# Patient Record
Sex: Female | Born: 1940 | Race: Black or African American | Hispanic: No | Marital: Single | Smoking: Never smoker
Health system: Southern US, Community
[De-identification: ages and names within clinical notes are randomized; demographics above are authoritative.]

## PROBLEM LIST (undated history)

## (undated) DIAGNOSIS — I10 Essential (primary) hypertension: Secondary | ICD-10-CM

## (undated) HISTORY — PX: VAGINAL PROLAPSE REPAIR: SHX830

---

## 2014-11-08 ENCOUNTER — Other Ambulatory Visit: Payer: Self-pay | Admitting: Family Medicine

## 2014-11-08 ENCOUNTER — Ambulatory Visit
Admission: RE | Admit: 2014-11-08 | Discharge: 2014-11-08 | Disposition: A | Discharge status: Home / Self Care | Payer: No Typology Code available for payment source | Source: Ambulatory Visit | Attending: Family Medicine | Admitting: Family Medicine

## 2014-11-08 DIAGNOSIS — M25551 Pain in right hip: Secondary | ICD-10-CM

## 2015-04-24 ENCOUNTER — Encounter (HOSPITAL_COMMUNITY): Payer: Self-pay | Admitting: Emergency Medicine

## 2015-04-24 ENCOUNTER — Emergency Department (HOSPITAL_COMMUNITY)
Admission: EM | Admit: 2015-04-24 | Discharge: 2015-04-24 | Disposition: A | Discharge status: Home / Self Care | Payer: Self-pay | Attending: Emergency Medicine | Admitting: Emergency Medicine

## 2015-04-24 DIAGNOSIS — N8110 Cystocele, unspecified: Secondary | ICD-10-CM | POA: Insufficient documentation

## 2015-04-24 DIAGNOSIS — I10 Essential (primary) hypertension: Secondary | ICD-10-CM | POA: Insufficient documentation

## 2015-04-24 DIAGNOSIS — Z23 Encounter for immunization: Secondary | ICD-10-CM | POA: Insufficient documentation

## 2015-04-24 HISTORY — DX: Essential (primary) hypertension: I10

## 2015-04-24 LAB — URINE MICROSCOPIC-ADD ON

## 2015-04-24 LAB — URINALYSIS, ROUTINE W REFLEX MICROSCOPIC
Bilirubin Urine: NEGATIVE
Glucose, UA: NEGATIVE mg/dL
Ketones, ur: NEGATIVE mg/dL
Nitrite: NEGATIVE
Protein, ur: 30 mg/dL — AB
SPECIFIC GRAVITY, URINE: 1.025 (ref 1.005–1.030)
Urobilinogen, UA: 0.2 mg/dL (ref 0.0–1.0)
pH: 6.5 (ref 5.0–8.0)

## 2015-04-24 MED ORDER — TETANUS-DIPHTH-ACELL PERTUSSIS 5-2.5-18.5 LF-MCG/0.5 IM SUSP
0.5000 mL | Freq: Once | INTRAMUSCULAR | Status: AC
  Administered 2015-04-24: 0.5 mL via INTRAMUSCULAR
  Filled 2015-04-24: qty 0.5

## 2015-04-24 MED ORDER — CEPHALEXIN 500 MG PO CAPS: 500.0000 mg | ORAL_CAPSULE | Freq: Two times a day (BID) | ORAL | Status: AC

## 2015-04-24 MED ORDER — SENNOSIDES-DOCUSATE SODIUM 8.6-50 MG PO TABS: 1.0000 | ORAL_TABLET | Freq: Every day | ORAL | Status: AC

## 2015-04-24 NOTE — ED Notes (Signed)
Per OBGYN, pt requires foley catheter to relieve bladder until packing can be removed from vagina at MD appointment

## 2015-04-24 NOTE — ED Provider Notes (Signed)
CSN: [id number]     Arrival date & time 04/24/15  0830 History   First MD Initiated Contact with Patient 04/24/15 0840     Chief Complaint  Patient presents with  . Vaginal Prolapse     (Consider location/radiation/quality/duration/timing/severity/associated sxs/prior Treatment) HPI  Blood pressure 155/75, pulse 74, temperature 99 F (37.2 C), temperature source Oral, resp. rate 16, height  (1.575 m), weight 174 lb (78.926 kg), SpO2 95 %.  Christina Hooper is a 74 y.o. female with past medical history significant for hypertension complaining of painful vaginal prolapse. Patient saw her PCP one week ago the prolapse is manually reduced but immediately recurred. Patient has not self reduced, she's had difficulty urinating and states she is afraid to have a bowel movement for fear of worsening the prolapse. Patient has had 6 children's, starting approximately 2 months ago and has just increased in severity. She does not have an OB/GYN but her primary care physician has arranged for her to see Dr. Juliene Pina in 4 days.  Past Medical History  Diagnosis Date  . Hypertension    History reviewed. No pertinent past surgical history. History reviewed. No pertinent family history. History  Substance Use Topics  . Smoking status: Never Smoker   . Smokeless tobacco: Not on file  . Alcohol Use: No   OB History    No data available     Review of Systems  10 systems reviewed and found to be negative, except as noted in the HPI.   Allergies  Review of patient's allergies indicates no known allergies.  Home Medications   Prior to Admission medications   Medication Sig Start Date End Date Taking? Authorizing Provider  cephALEXin (KEFLEX) 500 MG capsule Take 1 capsule (500 mg total) by mouth 2 (two) times daily. 04/24/15   Josephyne Tarter, PA-C  senna-docusate (SENOKOT-S) 8.6-50 MG per tablet Take 1 tablet by mouth daily. 04/24/15   Ahmaad Neidhardt, PA-C   BP 158/79 mmHg  Pulse 62  Temp(Src)  98.4 F (36.9 C) (Oral)  Resp 16  Ht  (1.575 m)  Wt 174 lb (78.926 kg)  BMI 31.82 kg/m2  SpO2 97% Physical Exam  Constitutional: She is oriented to person, place, and time. She appears well-developed and well-nourished. No distress.  HENT:  Head: Normocephalic.  Eyes: Conjunctivae and EOM are normal. Pupils are equal, round, and reactive to light.  Neck: Normal range of motion.  Cardiovascular: Normal rate and regular rhythm.   Pulmonary/Chest: Effort normal and breath sounds normal. No stridor.  Abdominal: Soft.  Genitourinary:  You exam is chaperoned by nurse: There is a 12 cm prolapse of the vaginal including the cervix with mild excoriation and no warmth, discharge or significant cellulitis.   Musculoskeletal: Normal range of motion.  Neurological: She is alert and oriented to person, place, and time.  Psychiatric: She has a normal mood and affect.  Nursing note and vitals reviewed.   ED Course  Hernia reduction Date/Time: 04/24/2015 12:38 PM Performed by: Wynetta Emery Authorized by: Wynetta Emery Consent: Verbal consent obtained. Consent given by: patient Patient identity confirmed: verbally with patient Time out: Immediately prior to procedure a "time out" was called to verify the correct patient, procedure, equipment, support staff and site/side marked as required. Preparation: Patient was prepped and draped in the usual sterile fashion. Local anesthesia used: no Patient tolerance: Patient tolerated the procedure well with no immediate complications Comments: Vagina manually reduced without complication, patient reports immediate improvement in pain and discomfort. Vagina  is packed with ultrasound gel coated Kerlix. Foley catheter placed.   (including critical care time) Labs Review Labs Reviewed  URINALYSIS, ROUTINE W REFLEX MICROSCOPIC (NOT AT [facility]) - Abnormal; Notable for the following:    Hgb urine dipstick MODERATE (*)    Protein, ur 30 (*)     Leukocytes, UA MODERATE (*)    All other components within normal limits  URINE MICROSCOPIC-ADD ON - Abnormal; Notable for the following:    Squamous Epithelial / LPF FEW (*)    Bacteria, UA MANY (*)    All other components within normal limits  URINE CULTURE    Imaging Review No results found.   EKG Interpretation None      MDM   Final diagnoses:  Vaginal prolapse    Filed Vitals:   04/24/15 1100 04/24/15 1130 04/24/15 1215 04/24/15 1223  BP: 147/83 164/79 158/79   Pulse: 76 68 62   Temp:    98.4 F (36.9 C)  TempSrc:    Oral  Resp:      Height:      Weight:      SpO2: 94% 95% 97%     Medications  Tdap (BOOSTRIX) injection 0.5 mL (0.5 mLs Intramuscular Given 04/24/15 0951)    Christina Hooper is a pleasant 74 y.o. female presenting with Significant vaginal/uterine prolapse. The prolapse has been out for approximately 1 week, the area is excoriated, there is no warmth or discharge to suggest infection. Patient states she's had difficulty urinating likely secondary to mechanical obstruction. Urinalysis with many bacteria but negative nitrites. Urine culture is ordered and Keflex will be given for presumed early UTI. She has no fever, CVA tenderness or signs of systemic infection. Prolapse is reduced in case discussed with Dr. Nikki Dom OB/GYN. She recommends packing the vagina with Kerlix. Patient will need a Foley because the compression from the vaginal packing will put pressure on the ureter. Packing is performed and patient is given prescription for stool softener and Keflex, Dr. Juliene Pina will see the patient in the office tomorrow.  Evaluation does not show pathology that would require ongoing emergent intervention or inpatient treatment. Pt is hemodynamically stable and mentating appropriately. Discussed findings and plan with patient/guardian, who agrees with care plan. All questions answered. Return precautions discussed and outpatient follow up given.   New  Prescriptions   CEPHALEXIN (KEFLEX) 500 MG CAPSULE    Take 1 capsule (500 mg total) by mouth 2 (two) times daily.   SENNA-DOCUSATE (SENOKOT-S) 8.6-50 MG PER TABLET    Take 1 tablet by mouth daily.        Wynetta Emery, PA-C 04/24/15 1244  Elwin Mocha, MD 04/24/15 1451

## 2015-04-24 NOTE — ED Notes (Signed)
Pt was diagnosed with a prolapsed uterus by primary MD. Pt is scheduled to see OBGYN however pain is so intense she cannot sit- pt could not wait for appointment. Pt here for pain management.

## 2015-04-24 NOTE — Discharge Instructions (Signed)
Please follow with your primary care doctor in the next 2 days for a check-up. They must obtain records for further management.   Do not hesitate to return to the Emergency Department for any new, worsening or concerning symptoms.   Patient information: Pelvic organ prolapse (The Basics) View in Spanish  Written by the doctors and editors at [employer]  What is pelvic organ prolapse? -- Pelvic organ prolapse is a condition that only affects women. It happens when tissues that support the organs in the lower belly relax. These tissues are sometimes called the "pelvic floor." When they relax too much, the organs drop down and press against or bulge into the vagina. If the bladder bulges into the vagina, doctors call this problem "cystocele." If the rectum bulges into the vagina, they call it "rectocele." Uterine prolapse means the uterus has bulged into the vagina (figure 1). Some things can increase a woman's risk of having pelvic organ prolapse. They include pregnancy, past hysterectomy (surgery to remove the uterus), obesity, and older age. What are the symptoms of pelvic organ prolapse? -- Many women with this problem have no symptoms. But some women with pelvic organ prolapse have symptoms that include: ?Fullness or pressure in the pelvis or vagina ?A bulge in the vagina or coming out of the vagina ?Leaking urine when they laugh, cough, or sneeze ?Needing to urinate all of a sudden When they use the toilet, some women need to press on the bulge in the vagina with a finger to get out all their urine or to finish a bowel movement. Is there a test for pelvic organ prolapse? -- Your doctor or nurse will be able to tell if you have it by doing a pelvic exam. Is there anything I can do on my own to feel better? -- Yes. Some women feel better if they do pelvic muscle exercises. These exercises strengthen the muscles that control the flow of urine and bowel movements. They are also known as "Kegel"  exercises. Your nurse or doctor can teach you how to do them or refer you to a physical therapist who specializes in pelvic floor problems. How is pelvic organ prolapse treated? -- Women who have no symptoms or who are not bothered by their symptoms do not need treatment. For women with symptoms that bother them, doctors suggest different treatments, including: ?Pelvic floor muscle exercises - Patients work with a physical therapist for 8 to 12 weeks to strengthen the pelvic muscles. ?A vaginal pessary - This device fits inside a woman's vagina to support the bladder and push it back into place. Pessaries come in different shapes and sizes. ?Surgery - A surgeon can move dropped organs back where they belong and strengthen the tissues that keep them in place. Women should have this type of surgery only if they are done having children. Can pelvic organ prolapse be prevented? -- You can reduce your chances of pelvic organ prolapse if you: ?Lose weight if you are overweight ?Get treated for constipation if you are constipated ?Avoid activities that require you to lift heavy things

## 2015-04-25 LAB — URINE CULTURE

## 2016-03-10 IMAGING — CR DG HIP (WITH OR WITHOUT PELVIS) 2-3V*R*
3 series · 3 of 3 positions shown · non-contrast
Comparison: None.

CLINICAL DATA: Right hip pain, no injury

EXAM:
RIGHT HIP (WITH PELVIS) 2-3 VIEWS

[view not recorded (1 of 3)]
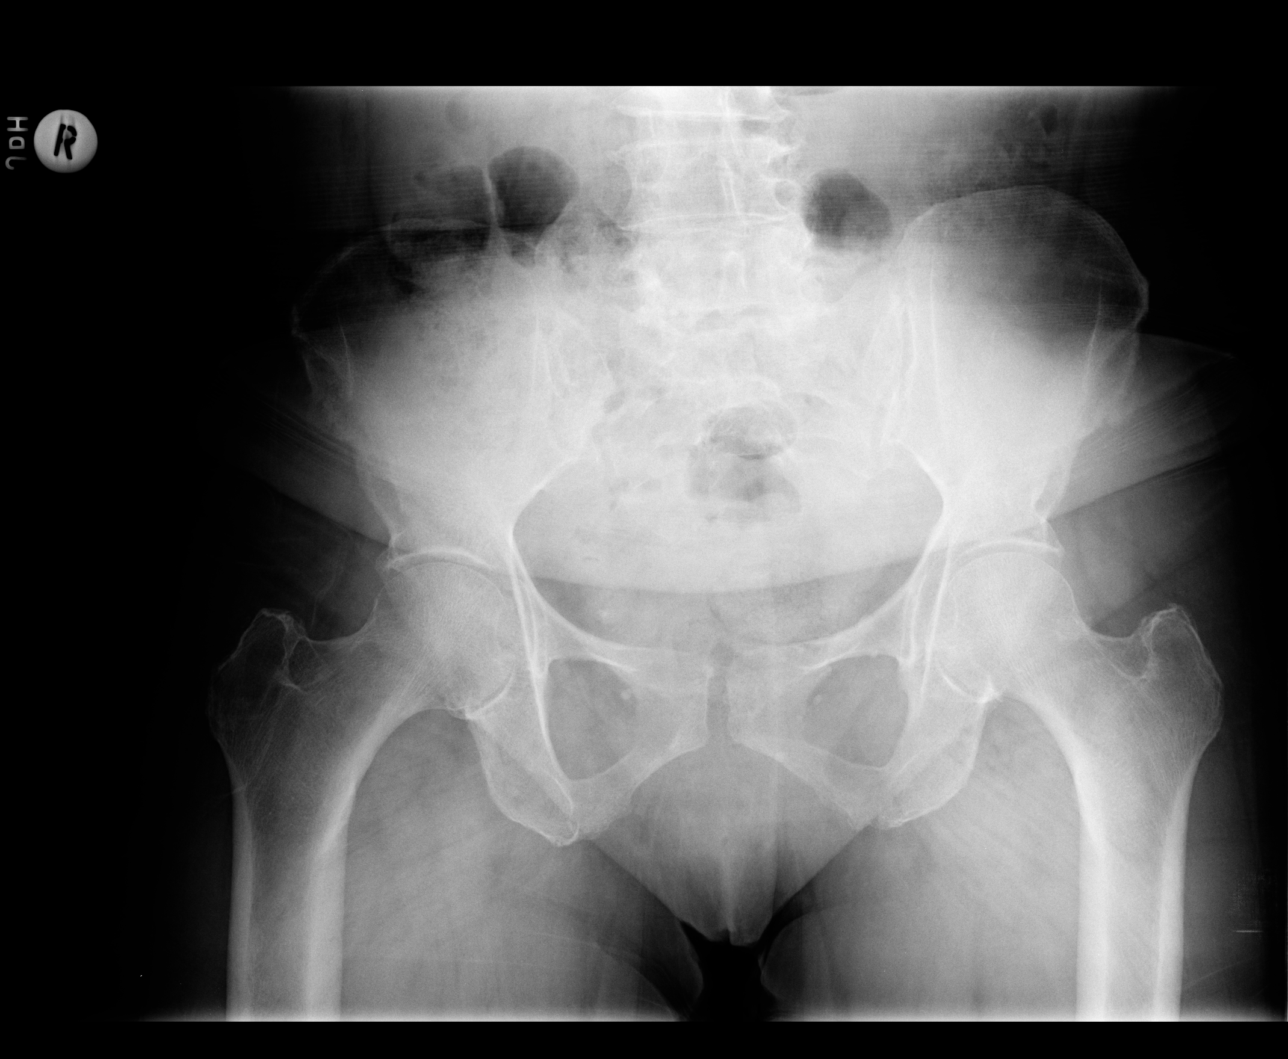

[view not recorded (2 of 3)]
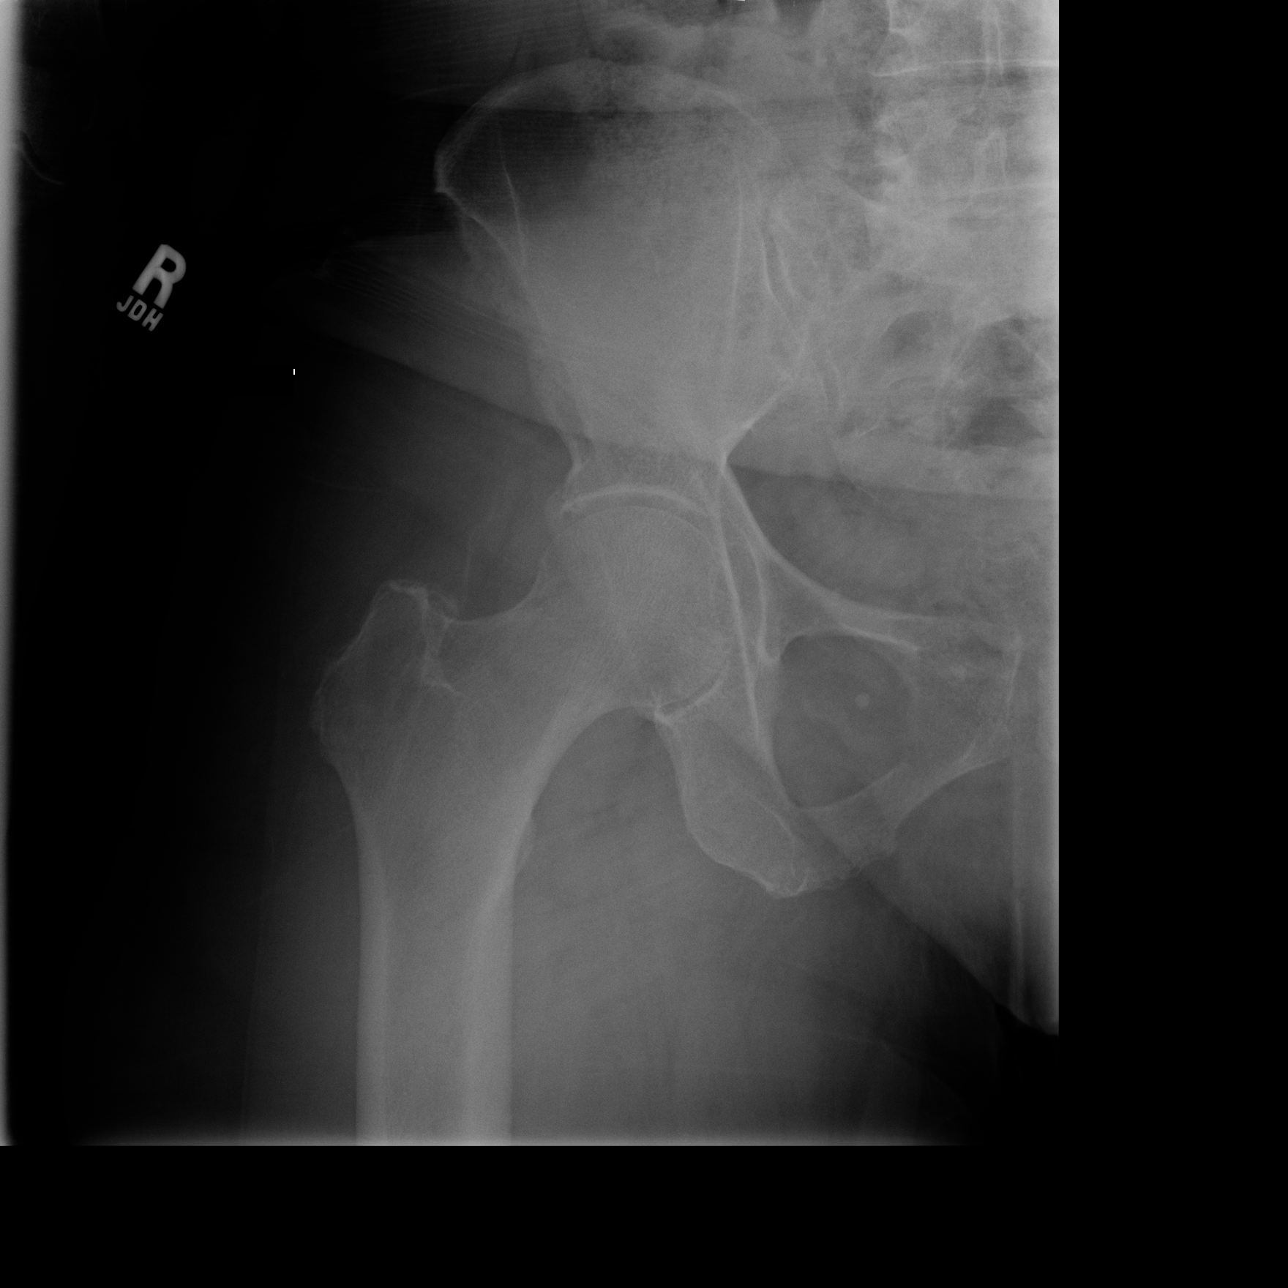

[view not recorded (3 of 3)]
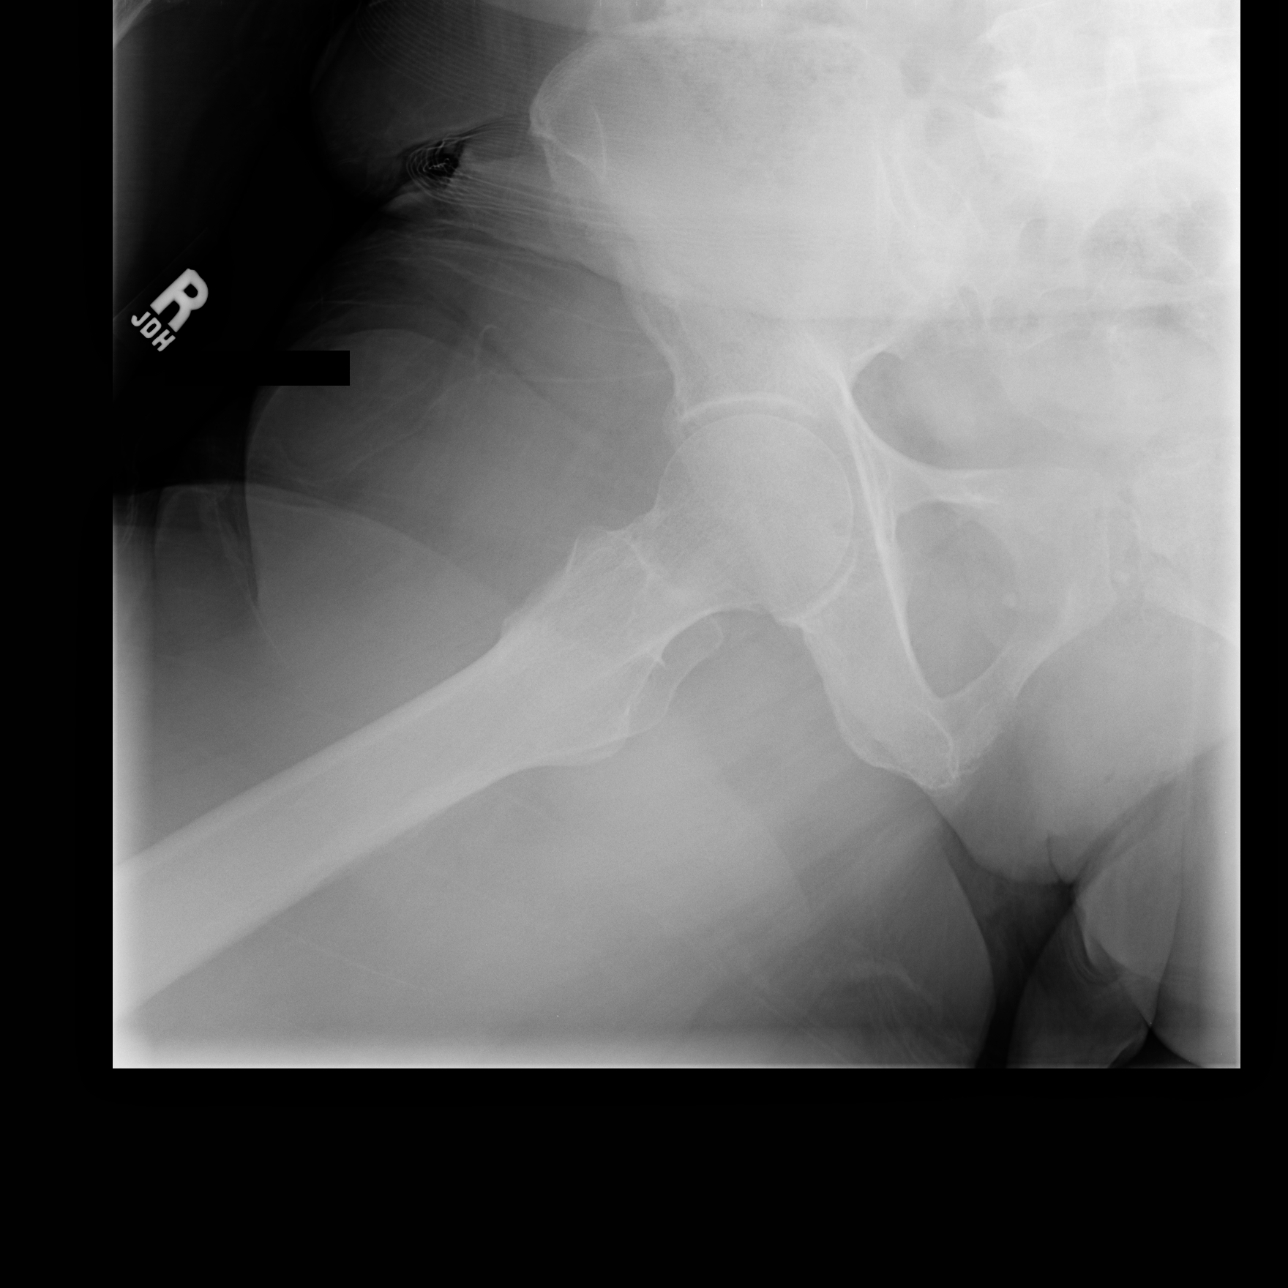

[3 of 3 positions shown; findings below may reference images not displayed]

FINDINGS: Negative for fracture or mass. Negative for AVN. Hip joint is normal
without significant degenerative change.
IMPRESSION: Negative

## 2021-01-05 ENCOUNTER — Other Ambulatory Visit: Payer: Self-pay | Admitting: Internal Medicine

## 2021-01-06 LAB — TSH: TSH: 0.01 mIU/L — ABNORMAL LOW (ref 0.40–4.50)

## 2021-01-06 LAB — COMPLETE METABOLIC PANEL WITH GFR
GFR, Est African American: 66 mL/min/{1.73_m2} (ref >=60)
Glucose, Bld: 67 mg/dL (ref 65–99)
Total Bilirubin: 1.1 mg/dL (ref 0.2–1.2)

## 2021-01-06 LAB — LIPID PANEL
Cholesterol: 165 mg/dL (ref <200)
LDL Cholesterol (Calc): 63 mg/dL (calc)

## 2021-01-06 LAB — CBC: Platelets: 216 10*3/uL (ref 140–400)

## 2021-01-10 LAB — COMPLETE METABOLIC PANEL WITH GFR
AG Ratio: 1.8 (calc) (ref 1.0–2.5)
ALT: 19 U/L (ref 6–29)
AST: 19 U/L (ref 10–35)
Albumin: 4.4 g/dL (ref 3.6–5.1)
Alkaline phosphatase (APISO): 68 U/L (ref 37–153)
BUN/Creatinine Ratio: 25 (calc) — ABNORMAL HIGH (ref 6–22)
BUN: 24 mg/dL (ref 7–25)
CO2: 32 mmol/L (ref 20–32)
Calcium: 10.2 mg/dL (ref 8.6–10.4)
Chloride: 104 mmol/L (ref 98–110)
Creat: 0.96 mg/dL — ABNORMAL HIGH (ref 0.60–0.93)
GFR, Est Non African American: 57 mL/min/{1.73_m2} — ABNORMAL LOW (ref >=60)
Globulin: 2.4 g/dL (calc) (ref 1.9–3.7)
Potassium: 4.1 mmol/L (ref 3.5–5.3)
Sodium: 143 mmol/L (ref 135–146)
Total Protein: 6.8 g/dL (ref 6.1–8.1)

## 2021-01-10 LAB — LIPID PANEL
HDL: 81 mg/dL (ref >=50)
Non-HDL Cholesterol (Calc): 84 mg/dL (calc) (ref <130)
Total CHOL/HDL Ratio: 2 (calc) (ref <5.0)
Triglycerides: 125 mg/dL (ref <150)

## 2021-01-10 LAB — CBC
HCT: 40.5 % (ref 35.0–45.0)
Hemoglobin: 12.8 g/dL (ref 11.7–15.5)
MCH: 29.1 pg (ref 27.0–33.0)
MCHC: 31.6 g/dL — ABNORMAL LOW (ref 32.0–36.0)
MCV: 92 fL (ref 80.0–100.0)
MPV: 11 fL (ref 7.5–12.5)
RBC: 4.4 10*6/uL (ref 3.80–5.10)
RDW: 11 % (ref 11.0–15.0)
WBC: 6.5 10*3/uL (ref 3.8–10.8)

## 2021-01-10 LAB — T3: T3, Total: 104 ng/dL (ref 76–181)

## 2021-01-10 LAB — T3 UPTAKE: T3 Uptake: 31 % (ref 22–35)

## 2021-01-10 LAB — T4, FREE: Free T4: 1.4 ng/dL (ref 0.8–1.8)

## 2022-07-11 ENCOUNTER — Encounter: Payer: Self-pay | Admitting: Family Medicine

## 2022-07-11 ENCOUNTER — Ambulatory Visit: Payer: 59 | Attending: Family Medicine | Admitting: Family Medicine

## 2022-07-11 VITALS — BP 134/69 | HR 78 | Temp 99.3°F | Ht 61.5 in | Wt 157.0 lb

## 2022-07-11 DIAGNOSIS — M1712 Unilateral primary osteoarthritis, left knee: Secondary | ICD-10-CM

## 2022-07-11 DIAGNOSIS — E2839 Other primary ovarian failure: Secondary | ICD-10-CM

## 2022-07-11 DIAGNOSIS — N3281 Overactive bladder: Secondary | ICD-10-CM | POA: Diagnosis not present

## 2022-07-11 DIAGNOSIS — M8588 Other specified disorders of bone density and structure, other site: Secondary | ICD-10-CM

## 2022-07-11 DIAGNOSIS — R42 Dizziness and giddiness: Secondary | ICD-10-CM

## 2022-07-11 DIAGNOSIS — M5137 Other intervertebral disc degeneration, lumbosacral region: Secondary | ICD-10-CM

## 2022-07-11 DIAGNOSIS — Z23 Encounter for immunization: Secondary | ICD-10-CM | POA: Diagnosis not present

## 2022-07-11 DIAGNOSIS — Z131 Encounter for screening for diabetes mellitus: Secondary | ICD-10-CM

## 2022-07-11 DIAGNOSIS — I10 Essential (primary) hypertension: Secondary | ICD-10-CM

## 2022-07-11 DIAGNOSIS — Z1159 Encounter for screening for other viral diseases: Secondary | ICD-10-CM

## 2022-07-11 DIAGNOSIS — K5909 Other constipation: Secondary | ICD-10-CM

## 2022-07-11 MED ORDER — SOLIFENACIN SUCCINATE 5 MG PO TABS: 5.0000 mg | ORAL_TABLET | Freq: Every day | ORAL | 3 refills | Status: AC

## 2022-07-11 MED ORDER — AMLODIPINE BESYLATE 5 MG PO TABS: 5.0000 mg | ORAL_TABLET | Freq: Every day | ORAL | 1 refills | Status: DC

## 2022-07-11 MED ORDER — HYDROCHLOROTHIAZIDE 12.5 MG PO TABS: 12.5000 mg | ORAL_TABLET | Freq: Every day | ORAL | 1 refills | Status: DC

## 2022-07-11 MED ORDER — MELOXICAM 7.5 MG PO TABS: 7.5000 mg | ORAL_TABLET | Freq: Every day | ORAL | 1 refills | Status: AC

## 2022-07-11 NOTE — Patient Instructions (Signed)

## 2022-07-11 NOTE — Progress Notes (Signed)
Subjective:  Patient ID: Christina Hooper, female    DOB: 06-12-1941  Age: 81 y.o. MRN: [id number]  CC: New Patient (Initial Visit)   HPI Christina Hooper is a 81 y.o. year old female with a history of hypertension, chronic back pain and chronic knee pain who presents to establish care. Accompanied by her daughter to today's visit.  Interval History: Complains of back pain, knee pain, dizziness. Back pain feels like someone sticking her and does not radiate down her legs. Lumbar spine x-ray from 09/2020 from care everywhere revealed: 1.  No acute fracture. 2.  Mild dextrocurvature centered at L3. 3.  Trace anterolisthesis of L4 on L5. 4.  Multilevel disc disease, advanced at L4-L5 and L5-S1. 5.  Multilevel facet osteoarthritic disease, advanced at L5-S1 6.  Mild sacroiliac joint degenerative changes. 7.  Osteopenia.       Knee pain was previously being managed by [facility] orthopedic.  Last visit was in 09/2020 when she underwent aspiration of left knee with cortisone injection. Knee pain is in bilateral knes worse in the left and feels like her knees are failing Left knee x-ray from 09/2020 revealed: 1.  No acute fracture or malalignment.  2.  No joint effusion.  3.  Moderate degenerative changes of the patellofemoral and medial compartment. 4.  Osteopenia.   Dizziness is described as " feeling tired after she has done a lot of work".  Denies presence of vertigo, tinnitus and this is not associated with change of position. She also complains of urinary frequency and nocturia of up to 7 times at night.  She has no dysuria or hematuria.  She has also be constipated. Past Medical History:  Diagnosis Date   Hypertension     Past Surgical History:  Procedure Laterality Date   VAGINAL PROLAPSE REPAIR      No family history on file.  Social History   Socioeconomic History   Marital status: Single    Spouse name: Not on file   Number of children: Not on file   Years of  education: Not on file   Highest education level: Not on file  Occupational History   Not on file  Tobacco Use   Smoking status: Never   Smokeless tobacco: Not on file  Substance and Sexual Activity   Alcohol use: No   Drug use: No   Sexual activity: Not on file  Other Topics Concern   Not on file  Social History Narrative   Not on file   Social Determinants of Health   Financial Resource Strain: Not on file  Food Insecurity: Not on file  Transportation Needs: Not on file  Physical Activity: Not on file  Stress: Not on file  Social Connections: Not on file    No Known Allergies  Outpatient Medications Prior to Visit  Medication Sig Dispense Refill   Omega-3 1000 MG CAPS Take by mouth.     omeprazole (PRILOSEC) 20 MG capsule Take 20 mg by mouth daily.     polyethylene glycol (MIRALAX / GLYCOLAX) 17 g packet Take 1 packet (17 g) by mouth daily as needed for constipation.     senna-docusate (SENOKOT-S) 8.6-50 MG per tablet Take 1 tablet by mouth daily. 20 tablet 0   amLODipine (NORVASC) 5 MG tablet Take 5 mg by mouth daily.     hydrochlorothiazide (HYDRODIURIL) 12.5 MG tablet Take 12.5 mg by mouth daily.     cephALEXin (KEFLEX) 500 MG capsule Take 1 capsule (500 mg total) by  mouth 2 (two) times daily. (Patient not taking: Reported on 07/11/2022) 20 capsule 0   No facility-administered medications prior to visit.     ROS Review of Systems  Constitutional:  Positive for fatigue. Negative for activity change and appetite change.  HENT:  Negative for sinus pressure and sore throat.   Respiratory:  Negative for chest tightness, shortness of breath and wheezing.   Cardiovascular:  Negative for chest pain and palpitations.  Gastrointestinal:  Negative for abdominal distention, abdominal pain and constipation.  Genitourinary: Negative.   Musculoskeletal:        See HPI  Psychiatric/Behavioral:  Negative for behavioral problems and dysphoric mood.     Objective:  BP 134/69    Pulse 78   Temp 99.3 F (37.4 C) (Oral)   Ht 5' 1.5" (1.562 m)   Wt 157 lb (71.2 kg)   SpO2 97%   BMI 29.18 kg/m      07/11/2022    9:08 AM 04/24/2015   12:15 PM 04/24/2015   11:30 AM  BP/Weight  Systolic BP 408 144 818  Diastolic BP 69 79 79  Wt. (Lbs) 157    BMI 29.18 kg/m2        Physical Exam Constitutional:      Appearance: She is well-developed.  Cardiovascular:     Rate and Rhythm: Normal rate.     Heart sounds: Normal heart sounds. No murmur heard. Pulmonary:     Effort: Pulmonary effort is normal.     Breath sounds: Normal breath sounds. No wheezing or rales.  Chest:     Chest wall: No tenderness.  Abdominal:     General: Bowel sounds are normal. There is no distension.     Palpations: Abdomen is soft. There is no mass.     Tenderness: There is no abdominal tenderness.  Musculoskeletal:        General: Normal range of motion.     Right lower leg: No edema.     Left lower leg: No edema.     Comments: No tenderness on palpation of back Crepitus or range of motion of both knees  Neurological:     Mental Status: She is alert and oriented to person, place, and time.  Psychiatric:        Mood and Affect: Mood normal.        Latest Ref Rng & Units 01/05/2021   12:00 AM  CMP  Glucose 65 - 99 mg/dL 67   BUN 7 - 25 mg/dL 24   Creatinine 0.60 - 0.93 mg/dL 0.96   Sodium 135 - 146 mmol/L 143   Potassium 3.5 - 5.3 mmol/L 4.1   Chloride 98 - 110 mmol/L 104   CO2 20 - 32 mmol/L 32   Calcium 8.6 - 10.4 mg/dL 10.2   Total Protein 6.1 - 8.1 g/dL 6.8   Total Bilirubin 0.2 - 1.2 mg/dL 1.1   AST 10 - 35 U/L 19   ALT 6 - 29 U/L 19     Lipid Panel     Component Value Date/Time   CHOL 165 01/05/2021 0000   TRIG 125 01/05/2021 0000   HDL 81 01/05/2021 0000   CHOLHDL 2.0 01/05/2021 0000   LDLCALC 63 01/05/2021 0000    CBC    Component Value Date/Time   WBC 6.5 01/05/2021 0000   RBC 4.40 01/05/2021 0000   HGB 12.8 01/05/2021 0000   HCT 40.5 01/05/2021  0000   PLT 216 01/05/2021 0000   MCV 92.0  01/05/2021 0000   MCH 29.1 01/05/2021 0000   MCHC 31.6 (L) 01/05/2021 0000   RDW 11.0 01/05/2021 0000    No results found for: "HGBA1C"  Assessment & Plan:  1. 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 Counseled on evidence of improvement in pain control with regards to yoga, water aerobics, massage, home physical therapy, exercise as tolerated. - Ambulatory referral to Physical Therapy - AMB referral to orthopedics - meloxicam (MOBIC) 7.5 MG tablet; Take 1 tablet (7.5 mg total) by mouth daily.  Dispense: 30 tablet; Refill: 1  2. Primary osteoarthritis of left knee Uncontrolled Status post cortisone injections in the past We will place an NSAID and refer her back to her orthopedic - Ambulatory referral to Physical Therapy - AMB referral to orthopedics - meloxicam (MOBIC) 7.5 MG tablet; Take 1 tablet (7.5 mg total) by mouth daily.  Dispense: 30 tablet; Refill: 1  3. Primary hypertension Controlled Counseled on blood pressure goal of less than 130/80, low-sodium, DASH diet, medication compliance, 150 minutes of moderate intensity exercise per week. Discussed medication compliance, adverse effects. - amLODipine (NORVASC) 5 MG tablet; Take 1 tablet (5 mg total) by mouth daily.  Dispense: 90 tablet; Refill: 1 - hydrochlorothiazide (HYDRODIURIL) 12.5 MG tablet; Take 1 tablet (12.5 mg total) by mouth daily.  Dispense: 90 tablet; Refill: 1 - LP+Non-HDL Cholesterol; Future - CMP14+EGFR; Future  4. Overactive bladder Counseled on pathophysiology of overactive bladder - solifenacin (VESICARE) 5 MG tablet; Take 1 tablet (5 mg total) by mouth daily.  Dispense: 30 tablet; Refill: 3  5. Other constipation Counseled on increasing fiber intake, fruits and vegetable, limit intake of foods like cheese, white bread, white rice Advised to use  MiraLAX  6. Dizziness Symptoms do not point towards vertigo We will need to exclude anemia Fatigue due to exertion could also be contributing and she has been advised to take intermittent breaks during the day - CBC with Differential/Platelet; Future  7. Screening for viral disease - HCV Ab w Reflex to Quant PCR; Future  8. Screening for diabetes mellitus - Hemoglobin A1c; Future  9. Estrogen deficiency - DG Bone Density; Future  10. Osteopenia of other site Evident on imaging - VITAMIN D 25 Hydroxy (Vit-D Deficiency, Fractures); Future  11. Flu vaccine need - Flu Vaccine QUAD High Dose(Fluad)    Meds ordered this encounter  Medications   amLODipine (NORVASC) 5 MG tablet    Sig: Take 1 tablet (5 mg total) by mouth daily.    Dispense:  90 tablet    Refill:  1   hydrochlorothiazide (HYDRODIURIL) 12.5 MG tablet    Sig: Take 1 tablet (12.5 mg total) by mouth daily.    Dispense:  90 tablet    Refill:  1   meloxicam (MOBIC) 7.5 MG tablet    Sig: Take 1 tablet (7.5 mg total) by mouth daily.    Dispense:  30 tablet    Refill:  1   solifenacin (VESICARE) 5 MG tablet    Sig: Take 1 tablet (5 mg total) by mouth daily.    Dispense:  30 tablet    Refill:  3    Follow-up: Return in about 3 months (around 10/11/2022) for Chronic medical conditions.       Charlott Rakes, MD, FAAFP. [facility] and [facility], [place]   07/11/2022, 1:05 PM

## 2022-07-11 NOTE — Progress Notes (Signed)
Having pain in lower back Bilateral knee pain. Dizziness

## 2022-07-16 ENCOUNTER — Ambulatory Visit: Payer: 59 | Attending: Family Medicine

## 2022-07-16 DIAGNOSIS — R42 Dizziness and giddiness: Secondary | ICD-10-CM

## 2022-07-16 DIAGNOSIS — M8588 Other specified disorders of bone density and structure, other site: Secondary | ICD-10-CM

## 2022-07-16 DIAGNOSIS — I10 Essential (primary) hypertension: Secondary | ICD-10-CM

## 2022-07-16 DIAGNOSIS — Z1159 Encounter for screening for other viral diseases: Secondary | ICD-10-CM

## 2022-07-16 DIAGNOSIS — Z131 Encounter for screening for diabetes mellitus: Secondary | ICD-10-CM

## 2022-07-17 ENCOUNTER — Other Ambulatory Visit: Payer: Self-pay | Admitting: Internal Medicine

## 2022-07-17 LAB — CBC WITH DIFFERENTIAL/PLATELET
Basophils Absolute: 0 10*3/uL (ref 0.0–0.2)
Basos: 0 %
EOS (ABSOLUTE): 0.1 10*3/uL (ref 0.0–0.4)
Eos: 1 %
Hematocrit: 38.2 % (ref 34.0–46.6)
Hemoglobin: 12.3 g/dL (ref 11.1–15.9)
Immature Grans (Abs): 0 10*3/uL (ref 0.0–0.1)
Immature Granulocytes: 0 %
Lymphocytes Absolute: 1.8 10*3/uL (ref 0.7–3.1)
Lymphs: 26 %
MCH: 27.6 pg (ref 26.6–33.0)
MCHC: 32.2 g/dL (ref 31.5–35.7)
MCV: 86 fL (ref 79–97)
Monocytes Absolute: 0.5 10*3/uL (ref 0.1–0.9)
Monocytes: 7 %
Neutrophils Absolute: 4.6 10*3/uL (ref 1.4–7.0)
Neutrophils: 66 %
Platelets: 267 10*3/uL (ref 150–450)
RBC: 4.46 x10E6/uL (ref 3.77–5.28)
RDW: 11.1 % — ABNORMAL LOW (ref 11.7–15.4)
WBC: 7 10*3/uL (ref 3.4–10.8)

## 2022-07-17 LAB — CMP14+EGFR
ALT: 13 IU/L (ref 0–32)
AST: 18 IU/L (ref 0–40)
Albumin/Globulin Ratio: 1.6 (ref 1.2–2.2)
Albumin: 4.5 g/dL (ref 3.8–4.8)
Alkaline Phosphatase: 97 IU/L (ref 44–121)
BUN/Creatinine Ratio: 24 (ref 12–28)
BUN: 23 mg/dL (ref 8–27)
Bilirubin Total: 1.1 mg/dL (ref 0.0–1.2)
CO2: 26 mmol/L (ref 20–29)
Calcium: 11 mg/dL — ABNORMAL HIGH (ref 8.7–10.3)
Chloride: 102 mmol/L (ref 96–106)
Creatinine, Ser: 0.97 mg/dL (ref 0.57–1.00)
Globulin, Total: 2.9 g/dL (ref 1.5–4.5)
Glucose: 87 mg/dL (ref 70–99)
Potassium: 4.7 mmol/L (ref 3.5–5.2)
Sodium: 143 mmol/L (ref 134–144)
Total Protein: 7.4 g/dL (ref 6.0–8.5)
eGFR: 59 mL/min/{1.73_m2} — ABNORMAL LOW (ref >59)

## 2022-07-17 LAB — LP+NON-HDL CHOLESTEROL
Cholesterol, Total: 163 mg/dL (ref 100–199)
HDL: 79 mg/dL (ref >39)
LDL Chol Calc (NIH): 73 mg/dL (ref 0–99)
Total Non-HDL-Chol (LDL+VLDL): 84 mg/dL (ref 0–129)
Triglycerides: 51 mg/dL (ref 0–149)
VLDL Cholesterol Cal: 11 mg/dL (ref 5–40)

## 2022-07-17 LAB — HEMOGLOBIN A1C
Est. average glucose Bld gHb Est-mCnc: 108 mg/dL
Hgb A1c MFr Bld: 5.4 % (ref 4.8–5.6)

## 2022-07-17 LAB — HCV INTERPRETATION

## 2022-07-17 LAB — HCV AB W REFLEX TO QUANT PCR: HCV Ab: NONREACTIVE

## 2022-07-17 LAB — VITAMIN D 25 HYDROXY (VIT D DEFICIENCY, FRACTURES): Vit D, 25-Hydroxy: 15.2 ng/mL — ABNORMAL LOW (ref 30.0–100.0)

## 2022-07-17 NOTE — Progress Notes (Signed)
Let patient know that her vitamin D level is low.  I recommend taking vitamin D supplement 800 IU daily.  This should be purchased over-the-counter. -Calcium level is elevated.  The medication hydrochlorothiazide can sometimes cause this.  Please return to the lab to have additional blood test done to evaluate the high calcium level further. -Blood cell counts normal. -Kidney function not 100% but stable compared to last year. -Screen for diabetes is negative. Screen for hepatitis C virus is negative. Cholesterol levels are normal.

## 2022-07-30 ENCOUNTER — Ambulatory Visit: Payer: 59 | Attending: Family Medicine

## 2022-08-03 LAB — TSH+T4F+T3FREE
Free T4: 1.41 ng/dL (ref 0.82–1.77)
T3, Free: 3.4 pg/mL (ref 2.0–4.4)
TSH: 0.016 u[IU]/mL — ABNORMAL LOW (ref 0.450–4.500)

## 2022-08-03 LAB — PTH, INTACT AND CALCIUM
Calcium: 10.6 mg/dL — ABNORMAL HIGH (ref 8.7–10.3)
PTH: 32 pg/mL (ref 15–65)

## 2022-08-03 LAB — PTH-RELATED PEPTIDE: PTH-related peptide: <2 pmol/L

## 2022-08-05 NOTE — Therapy (Signed)
OUTPATIENT PHYSICAL THERAPY THORACOLUMBAR & LOWER EXTREMITY EVALUATION   Patient Name: Christina Hooper MRN: [id number] DOB:1940/11/08, 81 y.o., female Today's Date: 08/06/2022   PT End of Session - 08/06/22 1019     Visit Number 1    Date for PT Re-Evaluation 09/17/22    Authorization Type [employer] - VL: 35 (PT/OT/ST/Chiro combined)    PT Start Time 1019    PT Stop Time 1101    PT Time Calculation (min) 42 min    Activity Tolerance Patient tolerated treatment well    Behavior During Therapy WFL for tasks assessed/performed             Past Medical History:  Diagnosis Date   Hypertension    Past Surgical History:  Procedure Laterality Date   VAGINAL PROLAPSE REPAIR     Patient Active Problem List   Diagnosis Date Noted   Hypertension 07/11/2022   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 07/11/2022   Primary osteoarthritis of left knee 07/11/2022   Overactive bladder 07/11/2022    PCP: Hoy Register, MD   REFERRING PROVIDER: Hoy Register, MD   REFERRING DIAG:  M51.37 (ICD-10-CM) - 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  M17.12 (ICD-10-CM) - Primary osteoarthritis of left knee    THERAPY DIAG:  Acute pain of left knee  Other low back pain  Muscle weakness (generalized)  Other muscle spasm  Difficulty in walking, not elsewhere classified  Other abnormalities of gait and mobility  RATIONALE FOR EVALUATION AND TREATMENT: Rehabilitation  ONSET DATE: Chronic  NEXT MD VISIT: 10/15/2022   SUBJECTIVE:                                                                                                                                                                                           SUBJECTIVE STATEMENT: Pt reports knee pain has been as issue for >2 yrs but the back pain only ~1 yr. Her back has been feeling better but still has mild pain. L knee pain seems to be affected by the weather. Subjective assessment limited by language barrier despite daughter  assisting with translation.  PAIN:  Are you having pain? Yes: NPRS scale: 7-8/10 Pain location: L anterior knee Pain description: aching Aggravating factors: going down stairs, walking, household chores Relieving factors: nothing  Are you having pain? Yes: NPRS scale: 5/10 Pain location: midline to L low back & buttocks Pain description: sore Aggravating factors: uncertain Relieving factors: Tylenol, hot water bottle, topical analgesic  PERTINENT HISTORY: HTN, OA, DDD, overactive bladder  PRECAUTIONS: Fall  WEIGHT BEARING RESTRICTIONS: No  FALLS:  Has patient fallen in last 6 months? Yes. Number of falls  1 - yesterday (foot slipped on the steps)  LIVING ENVIRONMENT: Lives with: lives with their family Lives in: House/apartment Stairs: Yes: Internal: 14 steps; on left going up Has following equipment at home: None  OCCUPATION: Retired  PLOF: Independent and Leisure: taking care of grandchildren, cooking, walking in the house/yard  PATIENT GOALS: "For the knee pain to leave me alone."   OBJECTIVE:   DIAGNOSTIC FINDINGS:  10/16/20 - Lumbar x-ray: Impression: 1.  No acute fracture. 2.  Mild dextrocurvature centered at L3. 3.  Trace anterolisthesis of L4 on L5. 4.  Multilevel disc disease, advanced at L4-L5 and L5-S1. 5.  Multilevel facet osteoarthritic disease, advanced at L5-S1 6.  Mild sacroiliac joint degenerative changes. 7.  Osteopenia. 10/16/20 - L knee x-ray (3 views): Impression: 1.  No acute fracture or malalignment. 2.  No joint effusion. 3.  Moderate degenerative changes of the patellofemoral and medial compartment. 4.  Osteopenia.  PATIENT SURVEYS:  Modified Oswestry 15 / 50 = 30.0%  LEFS 31 / 80 = 38.8%  SCREENING FOR RED FLAGS: Bowel or bladder incontinence: No Spinal tumors: No Cauda equina syndrome: No Compression fracture: No Abdominal aneurysm: No  COGNITION:  Overall cognitive status: Within functional limits for tasks  assessed    SENSATION: WFL  MUSCLE LENGTH: Hamstrings: WNL ITB: WNL Piriformis: mod tight B Hip flexors: mild tight B Quads: mild tight B Heelcord: NT  POSTURE:  No Significant postural limitations  PALPATION: Deferred d/t lack of time  LUMBAR ROM:   Active  A/PROM  eval  Flexion WNL  Extension WFL *  Right lateral flexion WNL  Left lateral flexion WNL *  Right rotation WFL  Left rotation WFL  (Blank rows = not tested) * pain in L low back/buttock  LOWER EXTREMITY ROM:     Active  Right eval Left eval  Hip flexion    Hip extension    Hip abduction    Hip adduction    Hip internal rotation    Hip external rotation    Knee flexion 127 125  Knee extension 0 0  Ankle dorsiflexion    Ankle plantarflexion    Ankle inversion    Ankle eversion    (Blank rows = not tested)  LOWER EXTREMITY MMT:  (tested in sitting on eval - pt having difficulty understanding testing instructions)  MMT Right eval Left eval  Hip flexion 4 4-  Hip extension 4 4-  Hip abduction 4 4  Hip adduction 4 4  Hip internal rotation    Hip external rotation    Knee flexion 4 4-  Knee extension 4 4  Ankle dorsiflexion    Ankle plantarflexion    Ankle inversion    Ankle eversion     (Blank rows = not tested)  LUMBAR SPECIAL TESTS:  Straight leg raise test: Negative and FABER test: Positive  FUNCTIONAL TESTS:  TBD as indicated  GAIT: Distance walked: 70 Assistive device utilized: None Level of assistance: Complete Independence Gait pattern: decreased stance time- Left and antalgic Comments: Pt reports more of a limp today after slip and fall on the stairs yesterday   TODAY'S TREATMENT:  08/06/22 Eval only   PATIENT EDUCATION:  Education details: PT eval findings and anticipated POC Person educated: Patient and Child(ren) Education method: Explanation Education comprehension: verbalized understanding   HOME EXERCISE PROGRAM: TBD   ASSESSMENT:  CLINICAL  IMPRESSION: Christina Hooper is a 81 y.o. female who was seen today for physical therapy evaluation and treatment for chronic  L-sided LBP and L knee pain secondary to OA. She reports onset of LBP >2 yrs ago with L-sided LBP ~1 yr ago with LBP becoming less intense of late. She reports pain is intermittent but has difficulty identifying aggravating or alleviating factors. Current deficits include antalgic gait, increased pain with lumbar extension and L lateral flexion, decreased hip/quad and glute/piriformis flexibility bilaterally, and B LE weakness. Assessment limited somewhat due to language barrier despite patient's claim of understanding [place] and daughter assisting with translation - will plan for formal interpretation services in future visits. [place] will benefit from skilled PT to address above deficits to improve mobility and activity tolerance with decreased pain interference.   OBJECTIVE IMPAIRMENTS: Abnormal gait, decreased activity tolerance, decreased balance, decreased endurance, decreased knowledge of condition, decreased mobility, difficulty walking, decreased strength, increased fascial restrictions, impaired perceived functional ability, increased muscle spasms, impaired flexibility, improper body mechanics, and pain.   ACTIVITY LIMITATIONS: carrying, lifting, sitting, standing, squatting, stairs, transfers, bed mobility, and locomotion level  PARTICIPATION LIMITATIONS: meal prep, cleaning, shopping, community activity, and caring for grandkids  PERSONAL FACTORS: Age, Fitness, Past/current experiences, Time since onset of injury/illness/exacerbation, and 3+ comorbidities: HTN, OA, DDD, overactive bladder  are also affecting patient's functional outcome.   REHAB POTENTIAL: Good  CLINICAL DECISION MAKING: Stable/uncomplicated  EVALUATION COMPLEXITY: Low   GOALS: Goals reviewed with patient? Yes  SHORT TERM GOALS: Target date: 08/27/2022   Patient will be independent with  initial HEP to improve outcomes and carryover.  Baseline:  Goal status: INITIAL  LONG TERM GOALS: Target date: 09/17/2022    Patient will be independent with ongoing/advanced HEP for self-management at home.  Baseline:  Goal status: INITIAL  2.  Patient will report 75% improvement in low back pain to improve QOL.  Baseline:  Goal status: INITIAL  3.  Patient will report 75% improvement in L knee pain to improve activity tolerance.  Baseline:  Goal status: INITIAL  4.  Patient to demonstrate ability to achieve and maintain good spinal alignment/posturing and body mechanics needed for daily activities. Baseline:  Goal status: INITIAL  5.  Patient will demonstrate full pain free lumbar ROM to perform ADLs.   Baseline:  Goal status: INITIAL  6.  Patient will demonstrate improved B LE strength to >/= 4+/5 for improved stability and ease of mobility . Baseline:  Goal status: INITIAL  7.  Patient will report >/=  40/80 on LEFS to demonstrate improved functional ability.  Baseline: 31 / 80 = 38.8% Goal status: INITIAL   8. Patient will report </= 18% on Modified Oswestry to demonstrate improved functional ability with decreased pain interference. Baseline: 15 / 50 = 30.0 % Goal status: INITIAL  9.  Patient to report ability to perform ADLs, household, and leisure activities without limitation due to LBP or L knee pain, LOM or weakness. Baseline:  Goal status: INITIAL   PLAN:  PT FREQUENCY: 2x/week  PT DURATION: 6 weeks  PLANNED INTERVENTIONS: Therapeutic exercises, Therapeutic activity, Neuromuscular re-education, Balance training, Gait training, Patient/Family education, Self Care, Joint mobilization, Stair training, DME instructions, Dry Needling, Electrical stimulation, Spinal mobilization, Cryotherapy, Moist heat, Taping, Vasopneumatic device, Traction, Ultrasound, Ionotophoresis 4mg /ml Dexamethasone, Manual therapy, and Re-evaluation.  PLAN FOR NEXT SESSION: Create  initial HEP addressing hip/quad and glute/piriformis tightness, core and proximal LE strengthening   , PT 08/06/2022, 2:56 PM

## 2022-08-06 ENCOUNTER — Encounter: Payer: Self-pay | Admitting: Physical Therapy

## 2022-08-06 ENCOUNTER — Ambulatory Visit: Payer: 59 | Attending: Family Medicine | Admitting: Physical Therapy

## 2022-08-06 ENCOUNTER — Other Ambulatory Visit: Payer: Self-pay

## 2022-08-06 DIAGNOSIS — M5137 Other intervertebral disc degeneration, lumbosacral region: Secondary | ICD-10-CM | POA: Insufficient documentation

## 2022-08-06 DIAGNOSIS — M5459 Other low back pain: Secondary | ICD-10-CM | POA: Insufficient documentation

## 2022-08-06 DIAGNOSIS — R262 Difficulty in walking, not elsewhere classified: Secondary | ICD-10-CM | POA: Insufficient documentation

## 2022-08-06 DIAGNOSIS — M1712 Unilateral primary osteoarthritis, left knee: Secondary | ICD-10-CM | POA: Insufficient documentation

## 2022-08-06 DIAGNOSIS — M25562 Pain in left knee: Secondary | ICD-10-CM | POA: Insufficient documentation

## 2022-08-06 DIAGNOSIS — M6281 Muscle weakness (generalized): Secondary | ICD-10-CM | POA: Insufficient documentation

## 2022-08-06 DIAGNOSIS — R2689 Other abnormalities of gait and mobility: Secondary | ICD-10-CM | POA: Insufficient documentation

## 2022-08-06 DIAGNOSIS — M62838 Other muscle spasm: Secondary | ICD-10-CM | POA: Insufficient documentation

## 2022-08-07 ENCOUNTER — Other Ambulatory Visit: Payer: Self-pay | Admitting: Family Medicine

## 2022-08-07 DIAGNOSIS — R7989 Other specified abnormal findings of blood chemistry: Secondary | ICD-10-CM

## 2022-08-13 ENCOUNTER — Ambulatory Visit: Payer: 59

## 2022-08-13 DIAGNOSIS — R2689 Other abnormalities of gait and mobility: Secondary | ICD-10-CM

## 2022-08-13 DIAGNOSIS — M25562 Pain in left knee: Secondary | ICD-10-CM

## 2022-08-13 DIAGNOSIS — R262 Difficulty in walking, not elsewhere classified: Secondary | ICD-10-CM

## 2022-08-13 DIAGNOSIS — M5459 Other low back pain: Secondary | ICD-10-CM

## 2022-08-13 DIAGNOSIS — M6281 Muscle weakness (generalized): Secondary | ICD-10-CM

## 2022-08-13 DIAGNOSIS — M62838 Other muscle spasm: Secondary | ICD-10-CM

## 2022-08-13 NOTE — Therapy (Signed)
OUTPATIENT PHYSICAL THERAPY TREATMENT   Patient Name: Christina Hooper MRN: [id number] DOB:06-Feb-1941, 81 y.o., female Today's Date: 08/13/2022   PT End of Session - 08/13/22 1104     Visit Number 2    Date for PT Re-Evaluation 09/17/22    Authorization Type [employer] - VL: 35 (PT/OT/ST/Chiro combined)    PT Start Time 1017    PT Stop Time 1101    PT Time Calculation (min) 44 min    Activity Tolerance Patient tolerated treatment well    Behavior During Therapy WFL for tasks assessed/performed              Past Medical History:  Diagnosis Date   Hypertension    Past Surgical History:  Procedure Laterality Date   VAGINAL PROLAPSE REPAIR     Patient Active Problem List   Diagnosis Date Noted   Hypertension 07/11/2022   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 07/11/2022   Primary osteoarthritis of left knee 07/11/2022   Overactive bladder 07/11/2022    PCP: Hoy Register, MD   REFERRING PROVIDER: Hoy Register, MD   REFERRING DIAG:  M51.37 (ICD-10-CM) - 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  M17.12 (ICD-10-CM) - Primary osteoarthritis of left knee    THERAPY DIAG:  Acute pain of left knee  Other low back pain  Muscle weakness (generalized)  Other muscle spasm  Difficulty in walking, not elsewhere classified  Other abnormalities of gait and mobility  RATIONALE FOR EVALUATION AND TREATMENT: Rehabilitation  ONSET DATE: Chronic  NEXT MD VISIT: 10/15/2022   SUBJECTIVE:                                                                                                                                                                                           SUBJECTIVE STATEMENT: Pt reports L side low back pain today but states some relief since eval.  PAIN:  Are you having pain? Yes: NPRS scale: 0/10 Pain location: L anterior knee Pain description: aching Aggravating factors: going down stairs, walking, household chores Relieving factors:  nothing  Are you having pain? Yes: NPRS scale: 7/10 Pain location: midline to L low back & buttocks Pain description: sore Aggravating factors: uncertain Relieving factors: Tylenol, hot water bottle, topical analgesic  PERTINENT HISTORY: HTN, OA, DDD, overactive bladder  PRECAUTIONS: Fall  WEIGHT BEARING RESTRICTIONS: No  FALLS:  Has patient fallen in last 6 months? Yes. Number of falls 1 - yesterday (foot slipped on the steps)  LIVING ENVIRONMENT: Lives with: lives with their family Lives in: House/apartment Stairs: Yes: Internal: 14 steps; on left going up Has following equipment at home: None  OCCUPATION: Retired  PLOF: Independent and Leisure: taking care of grandchildren, cooking, walking in the house/yard  PATIENT GOALS: "For the knee pain to leave me alone."   OBJECTIVE:   DIAGNOSTIC FINDINGS:  10/16/20 - Lumbar x-ray: Impression: 1.  No acute fracture. 2.  Mild dextrocurvature centered at L3. 3.  Trace anterolisthesis of L4 on L5. 4.  Multilevel disc disease, advanced at L4-L5 and L5-S1. 5.  Multilevel facet osteoarthritic disease, advanced at L5-S1 6.  Mild sacroiliac joint degenerative changes. 7.  Osteopenia. 10/16/20 - L knee x-ray (3 views): Impression: 1.  No acute fracture or malalignment. 2.  No joint effusion. 3.  Moderate degenerative changes of the patellofemoral and medial compartment. 4.  Osteopenia.  PATIENT SURVEYS:  Modified Oswestry 15 / 50 = 30.0%  LEFS 31 / 80 = 38.8%  SCREENING FOR RED FLAGS: Bowel or bladder incontinence: No Spinal tumors: No Cauda equina syndrome: No Compression fracture: No Abdominal aneurysm: No  COGNITION:  Overall cognitive status: Within functional limits for tasks assessed    SENSATION: WFL  MUSCLE LENGTH: Hamstrings: WNL ITB: WNL Piriformis: mod tight B Hip flexors: mild tight B Quads: mild tight B Heelcord: NT  POSTURE:  No Significant postural limitations  PALPATION: Deferred d/t lack  of time  LUMBAR ROM:   Active  A/PROM  eval  Flexion WNL  Extension WFL *  Right lateral flexion WNL  Left lateral flexion WNL *  Right rotation WFL  Left rotation WFL  (Blank rows = not tested) * pain in L low back/buttock  LOWER EXTREMITY ROM:     Active  Right eval Left eval  Hip flexion    Hip extension    Hip abduction    Hip adduction    Hip internal rotation    Hip external rotation    Knee flexion 127 125  Knee extension 0 0  Ankle dorsiflexion    Ankle plantarflexion    Ankle inversion    Ankle eversion    (Blank rows = not tested)  LOWER EXTREMITY MMT:  (tested in sitting on eval - pt having difficulty understanding testing instructions)  MMT Right eval Left eval  Hip flexion 4 4-  Hip extension 4 4-  Hip abduction 4 4  Hip adduction 4 4  Hip internal rotation    Hip external rotation    Knee flexion 4 4-  Knee extension 4 4  Ankle dorsiflexion    Ankle plantarflexion    Ankle inversion    Ankle eversion     (Blank rows = not tested)  LUMBAR SPECIAL TESTS:  Straight leg raise test: Negative and FABER test: Positive  FUNCTIONAL TESTS:  TBD as indicated  GAIT: Distance walked: 70 Assistive device utilized: None Level of assistance: Complete Independence Gait pattern: decreased stance time- Left and antalgic Comments: Pt reports more of a limp today after slip and fall on the stairs yesterday   TODAY'S TREATMENT:  Therapeutic Exercise: to improve strength and mobility.  Demo, verbal and tactile cues throughout for technique.  Nustep L3x25min Seated LAQ 2x10 Seated march 2x10; 1 set no resistance; 2nd set GTB  Seated piriformis stretch 3x15" hold BLE STS x 10  Seated hip ABD GTB 2x10 Step ups 6' x 10 BLE Step ups lateral 6' x 10 BLE  08/06/22 Eval only   PATIENT EDUCATION:  Education details: PT eval findings and anticipated POC Person educated: Patient and Child(ren) Education method: Explanation Education comprehension:  verbalized understanding   HOME EXERCISE PROGRAM: Access Code: [email or web address]  URL: [email or web address] Date: 08/13/2022 Prepared by: Verta Ellen  Exercises - Seated Piriformis Stretch  - 2 x daily - 7 x weekly - 3 sets - 15 second hold - Seated Hip Abduction with Resistance  - 1 x daily - 7 x weekly - 3 sets - 10 reps - Seated March with Resistance  - 1 x daily - 7 x weekly - 2 sets - 10 reps - Sit to Stand  - 1 x daily - 7 x weekly - 2 sets - 10 reps   ASSESSMENT:  CLINICAL IMPRESSION: Pt was able to participate in interventions, requiring lots of close monitoring and cueing throughout session. She required cues to avoid trunk extension with marches and LAQ. She demonstrated a good response to treatment and did ok with daughter used for interpreting.  OBJECTIVE IMPAIRMENTS: Abnormal gait, decreased activity tolerance, decreased balance, decreased endurance, decreased knowledge of condition, decreased mobility, difficulty walking, decreased strength, increased fascial restrictions, impaired perceived functional ability, increased muscle spasms, impaired flexibility, improper body mechanics, and pain.   ACTIVITY LIMITATIONS: carrying, lifting, sitting, standing, squatting, stairs, transfers, bed mobility, and locomotion level  PARTICIPATION LIMITATIONS: meal prep, cleaning, shopping, community activity, and caring for grandkids  PERSONAL FACTORS: Age, Fitness, Past/current experiences, Time since onset of injury/illness/exacerbation, and 3+ comorbidities: HTN, OA, DDD, overactive bladder  are also affecting patient's functional outcome.   REHAB POTENTIAL: Good  CLINICAL DECISION MAKING: Stable/uncomplicated  EVALUATION COMPLEXITY: Low   GOALS: Goals reviewed with patient? Yes  SHORT TERM GOALS: Target date: 08/27/2022   Patient will be independent with initial HEP to improve outcomes and carryover.  Baseline:  Goal status: INITIAL  LONG TERM GOALS: Target date:  09/17/2022    Patient will be independent with ongoing/advanced HEP for self-management at home.  Baseline:  Goal status: INITIAL  2.  Patient will report 75% improvement in low back pain to improve QOL.  Baseline:  Goal status: INITIAL  3.  Patient will report 75% improvement in L knee pain to improve activity tolerance.  Baseline:  Goal status: INITIAL  4.  Patient to demonstrate ability to achieve and maintain good spinal alignment/posturing and body mechanics needed for daily activities. Baseline:  Goal status: INITIAL  5.  Patient will demonstrate full pain free lumbar ROM to perform ADLs.   Baseline:  Goal status: INITIAL  6.  Patient will demonstrate improved B LE strength to >/= 4+/5 for improved stability and ease of mobility . Baseline:  Goal status: INITIAL  7.  Patient will report >/=  40/80 on LEFS to demonstrate improved functional ability.  Baseline: 31 / 80 = 38.8% Goal status: INITIAL   8. Patient will report </= 18% on Modified Oswestry to demonstrate improved functional ability with decreased pain interference. Baseline: 15 / 50 = 30.0 % Goal status: INITIAL  9.  Patient to report ability to perform ADLs, household, and leisure activities without limitation due to LBP or L knee pain, LOM or weakness. Baseline:  Goal status: INITIAL   PLAN:  PT FREQUENCY: 2x/week  PT DURATION: 6 weeks  PLANNED INTERVENTIONS: Therapeutic exercises, Therapeutic activity, Neuromuscular re-education, Balance training, Gait training, Patient/Family education, Self Care, Joint mobilization, Stair training, DME instructions, Dry Needling, Electrical stimulation, Spinal mobilization, Cryotherapy, Moist heat, Taping, Vasopneumatic device, Traction, Ultrasound, Ionotophoresis 4mg /ml Dexamethasone, Manual therapy, and Re-evaluation.  PLAN FOR NEXT SESSION: Create initial HEP addressing hip/quad and glute/piriformis tightness, core and proximal LE strengthening   Salvador Coupe L  Wilhelm Ganaway, PTA 08/13/2022, 11:05 AM

## 2022-08-19 ENCOUNTER — Ambulatory Visit: Payer: 59 | Attending: Family Medicine

## 2022-08-19 DIAGNOSIS — R7989 Other specified abnormal findings of blood chemistry: Secondary | ICD-10-CM

## 2022-08-20 ENCOUNTER — Ambulatory Visit: Payer: 59 | Admitting: Physical Therapy

## 2022-08-20 ENCOUNTER — Encounter: Payer: Self-pay | Admitting: Physical Therapy

## 2022-08-20 DIAGNOSIS — M6281 Muscle weakness (generalized): Secondary | ICD-10-CM

## 2022-08-20 DIAGNOSIS — M25562 Pain in left knee: Secondary | ICD-10-CM

## 2022-08-20 DIAGNOSIS — M5459 Other low back pain: Secondary | ICD-10-CM

## 2022-08-20 DIAGNOSIS — R262 Difficulty in walking, not elsewhere classified: Secondary | ICD-10-CM

## 2022-08-20 DIAGNOSIS — R2689 Other abnormalities of gait and mobility: Secondary | ICD-10-CM

## 2022-08-20 DIAGNOSIS — M62838 Other muscle spasm: Secondary | ICD-10-CM

## 2022-08-20 NOTE — Therapy (Signed)
OUTPATIENT PHYSICAL THERAPY TREATMENT   Patient Name: Christina Hooper MRN: [id number] DOB:July 19, 1941, 81 y.o., female Today's Date: 08/20/2022   PT End of Session - 08/20/22 1004     Visit Number 3    Date for PT Re-Evaluation 09/17/22    Authorization Type [employer] - VL: 35 (PT/OT/ST/Chiro combined)    PT Start Time 1004    PT Stop Time 1046    PT Time Calculation (min) 42 min    Activity Tolerance Patient tolerated treatment well    Behavior During Therapy WFL for tasks assessed/performed               Past Medical History:  Diagnosis Date   Hypertension    Past Surgical History:  Procedure Laterality Date   VAGINAL PROLAPSE REPAIR     Patient Active Problem List   Diagnosis Date Noted   Hypertension 07/11/2022   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 07/11/2022   Primary osteoarthritis of left knee 07/11/2022   Overactive bladder 07/11/2022    PCP: Hoy Register, MD   REFERRING PROVIDER: Hoy Register, MD   REFERRING DIAG:  M51.37 (ICD-10-CM) - 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  M17.12 (ICD-10-CM) - Primary osteoarthritis of left knee    THERAPY DIAG:  Acute pain of left knee  Other low back pain  Muscle weakness (generalized)  Other muscle spasm  Difficulty in walking, not elsewhere classified  Other abnormalities of gait and mobility  RATIONALE FOR EVALUATION AND TREATMENT: Rehabilitation  ONSET DATE: Chronic  NEXT MD VISIT: 10/15/2022   SUBJECTIVE:                                                                                                                                                                                           SUBJECTIVE STATEMENT: Pt reports pain in R side low back, but states that it is improving.   PAIN:  Are you having pain? Yes: NPRS scale: 1-2/10 Pain location: L anterior knee Pain description: aching Aggravating factors: going down stairs, walking, household chores Relieving factors:  nothing  Are you having pain? Yes: NPRS scale: 5/10 Pain location: midline to L low back & buttocks Pain description: sore Aggravating factors: uncertain Relieving factors: Tylenol, hot water bottle, topical analgesic  PERTINENT HISTORY: HTN, OA, DDD, overactive bladder  PRECAUTIONS: Fall  WEIGHT BEARING RESTRICTIONS: No  FALLS:  Has patient fallen in last 6 months? Yes. Number of falls 1 - yesterday (foot slipped on the steps)  LIVING ENVIRONMENT: Lives with: lives with their family Lives in: House/apartment Stairs: Yes: Internal: 14 steps; on left going up Has following equipment at home: None  OCCUPATION:  Retired  PLOF: Independent and Leisure: taking care of grandchildren, cooking, walking in the house/yard  PATIENT GOALS: "For the knee pain to leave me alone."   OBJECTIVE:   DIAGNOSTIC FINDINGS:  10/16/20 - Lumbar x-ray: Impression: 1.  No acute fracture. 2.  Mild dextrocurvature centered at L3. 3.  Trace anterolisthesis of L4 on L5. 4.  Multilevel disc disease, advanced at L4-L5 and L5-S1. 5.  Multilevel facet osteoarthritic disease, advanced at L5-S1 6.  Mild sacroiliac joint degenerative changes. 7.  Osteopenia. 10/16/20 - L knee x-ray (3 views): Impression: 1.  No acute fracture or malalignment. 2.  No joint effusion. 3.  Moderate degenerative changes of the patellofemoral and medial compartment. 4.  Osteopenia.  PATIENT SURVEYS:  Modified Oswestry 15 / 50 = 30.0%  LEFS 31 / 80 = 38.8%  SCREENING FOR RED FLAGS: Bowel or bladder incontinence: No Spinal tumors: No Cauda equina syndrome: No Compression fracture: No Abdominal aneurysm: No  COGNITION:  Overall cognitive status: Within functional limits for tasks assessed    SENSATION: WFL  MUSCLE LENGTH: Hamstrings: WNL ITB: WNL Piriformis: mod tight B Hip flexors: mild tight B Quads: mild tight B Heelcord: NT  POSTURE:  No Significant postural limitations  PALPATION: Deferred d/t lack  of time  LUMBAR ROM:   Active  A/PROM  eval  Flexion WNL  Extension WFL *  Right lateral flexion WNL  Left lateral flexion WNL *  Right rotation WFL  Left rotation WFL  (Blank rows = not tested) * pain in L low back/buttock  LOWER EXTREMITY ROM:     Active  Right eval Left eval  Hip flexion    Hip extension    Hip abduction    Hip adduction    Hip internal rotation    Hip external rotation    Knee flexion 127 125  Knee extension 0 0  Ankle dorsiflexion    Ankle plantarflexion    Ankle inversion    Ankle eversion    (Blank rows = not tested)  LOWER EXTREMITY MMT:  (tested in sitting on eval - pt having difficulty understanding testing instructions)  MMT Right eval Left eval  Hip flexion 4 4-  Hip extension 4 4-  Hip abduction 4 4  Hip adduction 4 4  Hip internal rotation    Hip external rotation    Knee flexion 4 4-  Knee extension 4 4  Ankle dorsiflexion    Ankle plantarflexion    Ankle inversion    Ankle eversion     (Blank rows = not tested)  LUMBAR SPECIAL TESTS:  Straight leg raise test: Negative and FABER test: Positive  FUNCTIONAL TESTS:  TBD as indicated  GAIT: Distance walked: 70 Assistive device utilized: None Level of assistance: Complete Independence Gait pattern: decreased stance time- Left and antalgic Comments: Pt reports more of a limp today after slip and fall on the stairs yesterday   TODAY'S TREATMENT:  08/20/22 THERAPEUTIC EXERCISE: to improve flexibility, strength and mobility.  Verbal and tactile cues throughout for technique. NuStep L4 x 6 min Supine quad stretch R/L 2x30 sec hold- decreased length on L>R  Supine SLR R/L 1x10- cues for slow controlled motion Seated piriformis stretch R/L 2x30 sec hold- cues to lean forward as tolerated for increased stretch Seated hip flexion marches RTB around knees 2x10 Seated single leg hip abduction R/L RTB around knees 2x10 Seated hip add iso ball squeeze 1x10 with 5 second hold-  visual and tactile cues to hold for 5  seconds Heel raises/Toe raises 2x10 Standing Lumbar Extension at wall 4x10 sec hold Standing Hip Abduction walks with RTB along wall 4x65ft- cues to keep tension on band during movement  08/13/22 Therapeutic Exercise: to improve strength and mobility.  Demo, verbal and tactile cues throughout for technique.  Nustep L3x73min Seated LAQ 2x10 Seated march 2x10; 1 set no resistance; 2nd set GTB  Seated piriformis stretch 3x15" hold BLE STS x 10  Seated hip ABD GTB 2x10 Step ups 6' x 10 BLE Step ups lateral 6' x 10 BLE  08/06/22 Eval only   PATIENT EDUCATION:  Education details: HEP update - heel raises/toe raises, lumbar extension at wall, banded hip abduction walks Person educated: Patient Education method: Explanation, Demonstration, and Handouts Education comprehension: verbalized understanding   HOME EXERCISE PROGRAM: Access Code: [email or web address] URL: [email or web address] Date: 08/20/2022 Prepared by: Rossie Muskrat  Exercises - Seated Piriformis Stretch  - 2 x daily - 7 x weekly - 3 sets - 15 second hold - Seated Hip Abduction with Resistance  - 1 x daily - 7 x weekly - 3 sets - 10 reps - Seated March with Resistance  - 1 x daily - 7 x weekly - 2 sets - 10 reps - Sit to Stand  - 1 x daily - 7 x weekly - 2 sets - 10 reps - Heel Toe Raises with Counter Support  - 1 x daily - 7 x weekly - 2 sets - 10 reps - Standing Lumbar Extension at Wall - Forearms  - 1 x daily - 7 x weekly - 2 sets - 10 reps - Side Stepping with Resistance at Thighs  - 1 x daily - 7 x weekly - 2 sets - 10 reps   ASSESSMENT:  CLINICAL IMPRESSION: Terena presenting today with decreased pain overall but still has some in her R side low back. She tolerated session well but still needs cueing for slow controlled motion and proper muscular activation. She did not need daughter for interpreting and was able to comprehend with tactile cues and demo of interventions.  This session focused on TE to increase LE muscle strength/flexibility and increase lumbar extension ROM with reduced pain. She will continue to benefit from therapy to address stated impairments.   OBJECTIVE IMPAIRMENTS: Abnormal gait, decreased activity tolerance, decreased balance, decreased endurance, decreased knowledge of condition, decreased mobility, difficulty walking, decreased strength, increased fascial restrictions, impaired perceived functional ability, increased muscle spasms, impaired flexibility, improper body mechanics, and pain.   ACTIVITY LIMITATIONS: carrying, lifting, sitting, standing, squatting, stairs, transfers, bed mobility, and locomotion level  PARTICIPATION LIMITATIONS: meal prep, cleaning, shopping, community activity, and caring for grandkids  PERSONAL FACTORS: Age, Fitness, Past/current experiences, Time since onset of injury/illness/exacerbation, and 3+ comorbidities: HTN, OA, DDD, overactive bladder  are also affecting patient's functional outcome.   REHAB POTENTIAL: Good  CLINICAL DECISION MAKING: Stable/uncomplicated  EVALUATION COMPLEXITY: Low   GOALS: Goals reviewed with patient? Yes  SHORT TERM GOALS: Target date: 08/27/2022   Patient will be independent with initial HEP to improve outcomes and carryover.  Baseline:  Goal status: MET11/28/23  LONG TERM GOALS: Target date: 09/17/2022    Patient will be independent with ongoing/advanced HEP for self-management at home.  Baseline:  Goal status: IN PROGRESS  2.  Patient will report 75% improvement in low back pain to improve QOL.  Baseline:  Goal status: IN PROGRESS  3.  Patient will report 75% improvement in L knee pain to improve activity tolerance.  Baseline:  Goal status:  IN PROGRESS  4.  Patient to demonstrate ability to achieve and maintain good spinal alignment/posturing and body mechanics needed for daily activities. Baseline:  Goal status: IN PROGRESS  5.  Patient will demonstrate  full pain free lumbar ROM to perform ADLs.   Baseline:  Goal status: IN PROGRESS  6.  Patient will demonstrate improved B LE strength to >/= 4+/5 for improved stability and ease of mobility . Baseline:  Goal status: IN PROGRESS  7.  Patient will report >/=  40/80 on LEFS to demonstrate improved functional ability.  Baseline: 31 / 80 = 38.8% Goal status: IN PROGRESS   8. Patient will report </= 18% on Modified Oswestry to demonstrate improved functional ability with decreased pain interference. Baseline: 15 / 50 = 30.0 % Goal status: IN PROGRESS  9.  Patient to report ability to perform ADLs, household, and leisure activities without limitation due to LBP or L knee pain, LOM or weakness. Baseline:  Goal status: IN PROGRESS   PLAN:  PT FREQUENCY: 2x/week  PT DURATION: 6 weeks  PLANNED INTERVENTIONS: Therapeutic exercises, Therapeutic activity, Neuromuscular re-education, Balance training, Gait training, Patient/Family education, Self Care, Joint mobilization, Stair training, DME instructions, Dry Needling, Electrical stimulation, Spinal mobilization, Cryotherapy, Moist heat, Taping, Vasopneumatic device, Traction, Ultrasound, Ionotophoresis 4mg /ml Dexamethasone, Manual therapy, and Re-evaluation.  PLAN FOR NEXT SESSION: hip/quad and glute/piriformis tightness, core and proximal LE strengthening, gait   Rossie MuskratOlivia Bryam Taborda, Student-PT 08/20/2022, 10:52 AM

## 2022-08-22 ENCOUNTER — Encounter: Payer: Self-pay | Admitting: Physical Therapy

## 2022-08-22 ENCOUNTER — Ambulatory Visit: Payer: 59 | Admitting: Physical Therapy

## 2022-08-22 DIAGNOSIS — M62838 Other muscle spasm: Secondary | ICD-10-CM

## 2022-08-22 DIAGNOSIS — M5459 Other low back pain: Secondary | ICD-10-CM

## 2022-08-22 DIAGNOSIS — R262 Difficulty in walking, not elsewhere classified: Secondary | ICD-10-CM

## 2022-08-22 DIAGNOSIS — M6281 Muscle weakness (generalized): Secondary | ICD-10-CM

## 2022-08-22 DIAGNOSIS — M25562 Pain in left knee: Secondary | ICD-10-CM

## 2022-08-22 DIAGNOSIS — R2689 Other abnormalities of gait and mobility: Secondary | ICD-10-CM

## 2022-08-22 LAB — THYROTROPIN RECEPTOR AUTOABS: Thyrotropin Receptor Ab: <1.1 IU/L (ref 0.00–1.75)

## 2022-08-22 LAB — TSH: TSH: 0.018 u[IU]/mL — ABNORMAL LOW (ref 0.450–4.500)

## 2022-08-22 LAB — THYROID PEROXIDASE ANTIBODY: Thyroperoxidase Ab SerPl-aCnc: 14 IU/mL (ref 0–34)

## 2022-08-22 LAB — T3: T3, Total: 108 ng/dL (ref 71–180)

## 2022-08-22 LAB — T4, FREE: Free T4: 1.39 ng/dL (ref 0.82–1.77)

## 2022-08-22 NOTE — Therapy (Signed)
OUTPATIENT PHYSICAL THERAPY TREATMENT   Patient Name: Christina Hooper MRN: [id number] DOB:[birth year], 81 y.o., female Today's Date: 08/22/2022   PT End of Session - 08/22/22 1017     Visit Number 4    Date for PT Re-Evaluation 09/17/22    Authorization Type [employer] - VL: 35 (PT/OT/ST/Chiro combined)    PT Start Time 1017    PT Stop Time 1057    PT Time Calculation (min) 40 min    Activity Tolerance Patient tolerated treatment well    Behavior During Therapy WFL for tasks assessed/performed               Past Medical History:  Diagnosis Date   Hypertension    Past Surgical History:  Procedure Laterality Date   VAGINAL PROLAPSE REPAIR     Patient Active Problem List   Diagnosis Date Noted   Hypertension 07/11/2022   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 07/11/2022   Primary osteoarthritis of left knee 07/11/2022   Overactive bladder 07/11/2022    PCP: Hoy Register, MD   REFERRING PROVIDER: Hoy Register, MD   REFERRING DIAG:  M51.37 (ICD-10-CM) - 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  M17.12 (ICD-10-CM) - Primary osteoarthritis of left knee    THERAPY DIAG:  Acute pain of left knee  Other low back pain  Muscle weakness (generalized)  Other muscle spasm  Difficulty in walking, not elsewhere classified  Other abnormalities of gait and mobility  RATIONALE FOR EVALUATION AND TREATMENT: Rehabilitation  ONSET DATE: Chronic  NEXT MD VISIT: 10/15/2022   SUBJECTIVE:                                                                                                                                                                                           SUBJECTIVE STATEMENT: Pt reports knee pain is less than the back pain but both are improving.   PAIN:  Are you having pain? Yes: NPRS scale: 1-2/10 Pain location: L anterior knee Pain description: aching Aggravating factors: going down stairs, walking, household chores Relieving factors:  nothing  Are you having pain? Yes: NPRS scale: 5/10 Pain location: midline to L low back & buttocks Pain description: sore Aggravating factors: uncertain Relieving factors: Tylenol, hot water bottle, topical analgesic  PERTINENT HISTORY: HTN, OA, DDD, overactive bladder  PRECAUTIONS: Fall  WEIGHT BEARING RESTRICTIONS: No  FALLS:  Has patient fallen in last 6 months? Yes. Number of falls 1 - yesterday (foot slipped on the steps)  LIVING ENVIRONMENT: Lives with: lives with their family Lives in: House/apartment Stairs: Yes: Internal: 14 steps; on left going up Has following equipment at home: None  OCCUPATION:  Retired  PLOF: Independent and Leisure: taking care of grandchildren, cooking, walking in the house/yard  PATIENT GOALS: "For the knee pain to leave me alone."   OBJECTIVE:   DIAGNOSTIC FINDINGS:  10/16/20 - Lumbar x-ray: Impression: 1.  No acute fracture. 2.  Mild dextrocurvature centered at L3. 3.  Trace anterolisthesis of L4 on L5. 4.  Multilevel disc disease, advanced at L4-L5 and L5-S1. 5.  Multilevel facet osteoarthritic disease, advanced at L5-S1 6.  Mild sacroiliac joint degenerative changes. 7.  Osteopenia. 10/16/20 - L knee x-ray (3 views): Impression: 1.  No acute fracture or malalignment. 2.  No joint effusion. 3.  Moderate degenerative changes of the patellofemoral and medial compartment. 4.  Osteopenia.  PATIENT SURVEYS:  Modified Oswestry 15 / 50 = 30.0%  LEFS 31 / 80 = 38.8%  SCREENING FOR RED FLAGS: Bowel or bladder incontinence: No Spinal tumors: No Cauda equina syndrome: No Compression fracture: No Abdominal aneurysm: No  COGNITION:  Overall cognitive status: Within functional limits for tasks assessed    SENSATION: WFL  MUSCLE LENGTH: Hamstrings: WNL ITB: WNL Piriformis: mod tight B Hip flexors: mild tight B Quads: mild tight B Heelcord: NT  POSTURE:  No Significant postural limitations  PALPATION: Deferred d/t lack  of time  LUMBAR ROM:   Active  A/PROM  eval  Flexion WNL  Extension WFL *  Right lateral flexion WNL  Left lateral flexion WNL *  Right rotation WFL  Left rotation WFL  (Blank rows = not tested) * pain in L low back/buttock  LOWER EXTREMITY ROM:     Active  Right eval Left eval  Hip flexion    Hip extension    Hip abduction    Hip adduction    Hip internal rotation    Hip external rotation    Knee flexion 127 125  Knee extension 0 0  Ankle dorsiflexion    Ankle plantarflexion    Ankle inversion    Ankle eversion    (Blank rows = not tested)  LOWER EXTREMITY MMT:  (tested in sitting on eval - pt having difficulty understanding testing instructions)  MMT Right eval Left eval  Hip flexion 4 4-  Hip extension 4 4-  Hip abduction 4 4  Hip adduction 4 4  Hip internal rotation    Hip external rotation    Knee flexion 4 4-  Knee extension 4 4  Ankle dorsiflexion    Ankle plantarflexion    Ankle inversion    Ankle eversion     (Blank rows = not tested)  LUMBAR SPECIAL TESTS:  Straight leg raise test: Negative and FABER test: Positive  FUNCTIONAL TESTS:  TBD as indicated  GAIT: Distance walked: 70 Assistive device utilized: None Level of assistance: Complete Independence Gait pattern: decreased stance time- Left and antalgic Comments: Pt reports more of a limp today after slip and fall on the stairs yesterday   TODAY'S TREATMENT:  08/22/22 THERAPEUTIC EXERCISE: to improve flexibility, strength and mobility.  Verbal and tactile cues throughout for technique. NuStep L4 x 6 min Hooklying L KTOS piriformis stretch 2 x 30" Attempted l figure-4 heel to hip piriformis stretch but deferred d/t increased L knee pain Seated L QL stretch 2 x 30" Seated 3-way lumbar flexion stretch with UE support on green Pball x 30" each  MANUAL THERAPY: To promote normalized muscle tension and reduced pain. STM/DTM and manual TPR to L upper glutes  SELF CARE: Provided  instruction in self-STM techniques to L glutes  using tennis ball on wall.   08/20/22 THERAPEUTIC EXERCISE: to improve flexibility, strength and mobility.  Verbal and tactile cues throughout for technique. NuStep L4 x 6 min Supine quad stretch R/L 2x30 sec hold- decreased length on L>R  Supine SLR R/L 1x10- cues for slow controlled motion Seated piriformis stretch R/L 2x30 sec hold- cues to lean forward as tolerated for increased stretch Seated hip flexion marches RTB around knees 2x10 Seated single leg hip abduction R/L RTB around knees 2x10 Seated hip add iso ball squeeze 1x10 with 5 second hold- visual and tactile cues to hold for 5 seconds Heel raises/Toe raises 2x10 Standing Lumbar Extension at wall 4x10 sec hold Standing Hip Abduction walks with RTB along wall 4x77ft- cues to keep tension on band during movement   08/13/22 Therapeutic Exercise: to improve strength and mobility.  Demo, verbal and tactile cues throughout for technique.  Nustep L3x66min Seated LAQ 2x10 Seated march 2x10; 1 set no resistance; 2nd set GTB  Seated piriformis stretch 3x15" hold BLE STS x 10  Seated hip ABD GTB 2x10 Step ups 6' x 10 BLE Step ups lateral 6' x 10 BLE   PATIENT EDUCATION:  Education details: HEP update - QL and lumbar flexion stretches and self-STM techniques to L glutes using tennis ball on wall Person educated: Patient Education method: Explanation, Demonstration, Verbal cues, and Handouts Education comprehension: verbalized understanding, verbal cues required, and needs further education   HOME EXERCISE PROGRAM: Access Code: [email or web address] URL: [email or web address] Date: 08/22/2022 Prepared by: Glenetta Hew  Exercises - Seated Piriformis Stretch  - 2 x daily - 7 x weekly - 3 sets - 15 second hold - Seated Hip Abduction with Resistance  - 1 x daily - 7 x weekly - 3 sets - 10 reps - Seated March with Resistance  - 1 x daily - 7 x weekly - 2 sets - 10 reps - Sit to  Stand  - 1 x daily - 7 x weekly - 2 sets - 10 reps - Heel Toe Raises with Counter Support  - 1 x daily - 7 x weekly - 2 sets - 10 reps - Standing Lumbar Extension at Wall - Forearms  - 1 x daily - 7 x weekly - 2 sets - 10 reps - Side Stepping with Resistance at Thighs  - 1 x daily - 7 x weekly - 2 sets - 10 reps - Seated Quadratus Lumborum Stretch in Chair (Mirrored)  - 2 x daily - 7 x weekly - 3 reps - 30 sec hold - Seated Flexion Stretch with Swiss Ball  - 2 x daily - 7 x weekly - 3 reps - 30 sec hold - Seated Thoracic Flexion and Rotation with Swiss Ball  - 2 x daily - 7 x weekly - 3 reps - 30 sec hold - Glute Max Release With [employer] Against Wall  - 1 x daily - 7 x weekly - 2 sets - 1-2 min hold   ASSESSMENT:  CLINICAL IMPRESSION: Aariah reports her pain continues to improve although pain ratings unchanged today.  Her pain today is primarily located to her left upper buttock and lower quadratus lumborum with TTP present - this was addressed with manual STM/DTM and manual TPR followed by stretching for QL and lumbar/upper glutes musculature.  Education provided and options for self soft tissue mobilization at home using tennis ball on wall.  HEP handout updated to reflect stretching progression and self-STM.  Ysidra will continue to benefit from physical therapy  to improve flexibility and strength for reduced pain and improved activity tolerance.  OBJECTIVE IMPAIRMENTS: Abnormal gait, decreased activity tolerance, decreased balance, decreased endurance, decreased knowledge of condition, decreased mobility, difficulty walking, decreased strength, increased fascial restrictions, impaired perceived functional ability, increased muscle spasms, impaired flexibility, improper body mechanics, and pain.   ACTIVITY LIMITATIONS: carrying, lifting, sitting, standing, squatting, stairs, transfers, bed mobility, and locomotion level  PARTICIPATION LIMITATIONS: meal prep, cleaning, shopping, community  activity, and caring for grandkids  PERSONAL FACTORS: Age, Fitness, Past/current experiences, Time since onset of injury/illness/exacerbation, and 3+ comorbidities: HTN, OA, DDD, overactive bladder  are also affecting patient's functional outcome.   REHAB POTENTIAL: Good  CLINICAL DECISION MAKING: Stable/uncomplicated  EVALUATION COMPLEXITY: Low   GOALS: Goals reviewed with patient? Yes  SHORT TERM GOALS: Target date: 08/27/2022   Patient will be independent with initial HEP to improve outcomes and carryover.  Baseline:  Goal status: MET11/28/23  LONG TERM GOALS: Target date: 09/17/2022    Patient will be independent with ongoing/advanced HEP for self-management at home.  Baseline:  Goal status: IN PROGRESS  2.  Patient will report 75% improvement in low back pain to improve QOL.  Baseline:  Goal status: IN PROGRESS  3.  Patient will report 75% improvement in L knee pain to improve activity tolerance.  Baseline:  Goal status: IN PROGRESS  4.  Patient to demonstrate ability to achieve and maintain good spinal alignment/posturing and body mechanics needed for daily activities. Baseline:  Goal status: IN PROGRESS  5.  Patient will demonstrate full pain free lumbar ROM to perform ADLs.   Baseline:  Goal status: IN PROGRESS  6.  Patient will demonstrate improved B LE strength to >/= 4+/5 for improved stability and ease of mobility . Baseline:  Goal status: IN PROGRESS  7.  Patient will report >/=  40/80 on LEFS to demonstrate improved functional ability.  Baseline: 31 / 80 = 38.8% Goal status: IN PROGRESS   8. Patient will report </= 18% on Modified Oswestry to demonstrate improved functional ability with decreased pain interference. Baseline: 15 / 50 = 30.0 % Goal status: IN PROGRESS  9.  Patient to report ability to perform ADLs, household, and leisure activities without limitation due to LBP or L knee pain, LOM or weakness. Baseline:  Goal status: IN  PROGRESS   PLAN:  PT FREQUENCY: 2x/week  PT DURATION: 6 weeks  PLANNED INTERVENTIONS: Therapeutic exercises, Therapeutic activity, Neuromuscular re-education, Balance training, Gait training, Patient/Family education, Self Care, Joint mobilization, Stair training, DME instructions, Dry Needling, Electrical stimulation, Spinal mobilization, Cryotherapy, Moist heat, Taping, Vasopneumatic device, Traction, Ultrasound, Ionotophoresis 4mg /ml Dexamethasone, Manual therapy, and Re-evaluation.  PLAN FOR NEXT SESSION: hip/quad and glute/piriformis tightness, core and proximal LE strengthening, gait   , PT 08/22/2022, 12:17 PM

## 2022-08-23 ENCOUNTER — Other Ambulatory Visit: Payer: Self-pay | Admitting: Family Medicine

## 2022-08-23 MED ORDER — METHIMAZOLE 5 MG PO TABS: 5.0000 mg | ORAL_TABLET | Freq: Two times a day (BID) | ORAL | 2 refills | Status: AC

## 2022-08-27 ENCOUNTER — Ambulatory Visit: Payer: Self-pay | Attending: Family Medicine | Admitting: Physical Therapy

## 2022-08-27 ENCOUNTER — Encounter: Payer: Self-pay | Admitting: Physical Therapy

## 2022-08-27 DIAGNOSIS — M62838 Other muscle spasm: Secondary | ICD-10-CM | POA: Insufficient documentation

## 2022-08-27 DIAGNOSIS — R2689 Other abnormalities of gait and mobility: Secondary | ICD-10-CM | POA: Insufficient documentation

## 2022-08-27 DIAGNOSIS — M25562 Pain in left knee: Secondary | ICD-10-CM | POA: Insufficient documentation

## 2022-08-27 DIAGNOSIS — M5459 Other low back pain: Secondary | ICD-10-CM | POA: Insufficient documentation

## 2022-08-27 DIAGNOSIS — M6281 Muscle weakness (generalized): Secondary | ICD-10-CM | POA: Insufficient documentation

## 2022-08-27 DIAGNOSIS — R262 Difficulty in walking, not elsewhere classified: Secondary | ICD-10-CM | POA: Insufficient documentation

## 2022-08-27 NOTE — Therapy (Signed)
OUTPATIENT PHYSICAL THERAPY TREATMENT   Patient Name: Christina Hooper MRN: [id number] DOB:[birth year], 81 y.o., female Today's Date: 08/27/2022   PT End of Session - 08/27/22 1017     Visit Number 5    Date for PT Re-Evaluation 09/17/22    Authorization Type [employer] - VL: 35 (PT/OT/ST/Chiro combined)    PT Start Time 1017    PT Stop Time 1058    PT Time Calculation (min) 41 min    Activity Tolerance Patient tolerated treatment well    Behavior During Therapy WFL for tasks assessed/performed               Past Medical History:  Diagnosis Date   Hypertension    Past Surgical History:  Procedure Laterality Date   VAGINAL PROLAPSE REPAIR     Patient Active Problem List   Diagnosis Date Noted   Hypertension 07/11/2022   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 07/11/2022   Primary osteoarthritis of left knee 07/11/2022   Overactive bladder 07/11/2022    PCP: Charlott Rakes, MD   REFERRING PROVIDER: Charlott Rakes, MD   REFERRING DIAG:  M51.37 (ICD-10-CM) - 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  M17.12 (ICD-10-CM) - Primary osteoarthritis of left knee    THERAPY DIAG:  Acute pain of left knee  Other low back pain  Muscle weakness (generalized)  Other muscle spasm  Difficulty in walking, not elsewhere classified  Other abnormalities of gait and mobility  RATIONALE FOR EVALUATION AND TREATMENT: Rehabilitation  ONSET DATE: Chronic  NEXT MD VISIT: 10/15/2022   SUBJECTIVE:                                                                                                                                                                                           SUBJECTIVE STATEMENT: Pt reports that therapy has helped her a lot and she likes the stretches to do at home.   PAIN:  Are you having pain? Yes: NPRS scale: 1-2/10 Pain location: L anterior knee Pain description: aching Aggravating factors: going down stairs, walking, household chores Relieving  factors: nothing  Are you having pain? Yes: NPRS scale: 5/10 Pain location: midline to L low back & buttocks Pain description: sore Aggravating factors: uncertain Relieving factors: Tylenol, hot water bottle, topical analgesic  PERTINENT HISTORY: HTN, OA, DDD, overactive bladder  PRECAUTIONS: Fall  WEIGHT BEARING RESTRICTIONS: No  FALLS:  Has patient fallen in last 6 months? Yes. Number of falls 1 - yesterday (foot slipped on the steps)  LIVING ENVIRONMENT: Lives with: lives with their family Lives in: House/apartment Stairs: Yes: Internal: 14 steps; on left going up Has following equipment at  home: None  OCCUPATION: Retired  PLOF: Independent and Leisure: taking care of grandchildren, cooking, walking in the house/yard  PATIENT GOALS: "For the knee pain to leave me alone."   OBJECTIVE:   DIAGNOSTIC FINDINGS:  10/16/20 - Lumbar x-ray: Impression: 1.  No acute fracture. 2.  Mild dextrocurvature centered at L3. 3.  Trace anterolisthesis of L4 on L5. 4.  Multilevel disc disease, advanced at L4-L5 and L5-S1. 5.  Multilevel facet osteoarthritic disease, advanced at L5-S1 6.  Mild sacroiliac joint degenerative changes. 7.  Osteopenia. 10/16/20 - L knee x-ray (3 views): Impression: 1.  No acute fracture or malalignment. 2.  No joint effusion. 3.  Moderate degenerative changes of the patellofemoral and medial compartment. 4.  Osteopenia.  PATIENT SURVEYS:  Modified Oswestry 15 / 50 = 30.0%  LEFS 31 / 80 = 38.8%  SCREENING FOR RED FLAGS: Bowel or bladder incontinence: No Spinal tumors: No Cauda equina syndrome: No Compression fracture: No Abdominal aneurysm: No  COGNITION:  Overall cognitive status: Within functional limits for tasks assessed    SENSATION: WFL  MUSCLE LENGTH: Hamstrings: WNL ITB: WNL Piriformis: mod tight B Hip flexors: mild tight B Quads: mild tight B Heelcord: NT  POSTURE:  No Significant postural limitations  PALPATION: Deferred  d/t lack of time  LUMBAR ROM:   Active  A/PROM  eval  Flexion WNL  Extension WFL *  Right lateral flexion WNL  Left lateral flexion WNL *  Right rotation WFL  Left rotation WFL  (Blank rows = not tested) * pain in L low back/buttock  LOWER EXTREMITY ROM:     Active  Right eval Left eval  Hip flexion    Hip extension    Hip abduction    Hip adduction    Hip internal rotation    Hip external rotation    Knee flexion 127 125  Knee extension 0 0  Ankle dorsiflexion    Ankle plantarflexion    Ankle inversion    Ankle eversion    (Blank rows = not tested)  LOWER EXTREMITY MMT:  (tested in sitting on eval - pt having difficulty understanding testing instructions)  MMT Right eval Left eval  Hip flexion 4 4-  Hip extension 4 4-  Hip abduction 4 4  Hip adduction 4 4  Hip internal rotation    Hip external rotation    Knee flexion 4 4-  Knee extension 4 4  Ankle dorsiflexion    Ankle plantarflexion    Ankle inversion    Ankle eversion     (Blank rows = not tested)  LUMBAR SPECIAL TESTS:  Straight leg raise test: Negative and FABER test: Positive  FUNCTIONAL TESTS:  TBD as indicated  GAIT: Distance walked: 70 Assistive device utilized: None Level of assistance: Complete Independence Gait pattern: decreased stance time- Left and antalgic Comments: Pt reports more of a limp today after slip and fall on the stairs yesterday   TODAY'S TREATMENT: 08/27/22 THERAPEUTIC EXERCISE: to improve flexibility, strength and mobility.  Verbal and tactile cues throughout for technique. Walking 270 ft around track Standing Hip Flexion leaning on table- forward, R,L -2x30 sec each, cues to use table at home since she doesn't have a ball Seated QL Stretch R/L 2x30 sec Seated lumbar forward flexion repeated motions 2x10 with 3 sec holds Supine figure 4 piriformis stretch with self overpressure pulling knee to chest R/L 2x30 sec  Supine TrA contraction and PPT 1x10 with 5 sec  hold- tactile and demonstration needed to  perform Glute Bridges 2x20- tactile cues for full hip extension with 5 sec hold Prone Lying 2x30 sec- attempted on elbows but increased LBP  MANUAL THERAPY: To promote normalized muscle tension and reduced pain. STM L piriformis, upper glutes, and lumbar paraspinals   08/22/22 THERAPEUTIC EXERCISE: to improve flexibility, strength and mobility.  Verbal and tactile cues throughout for technique. NuStep L4 x 6 min Hooklying L KTOS piriformis stretch 2 x 30" Attempted l figure-4 heel to hip piriformis stretch but deferred d/t increased L knee pain Seated L QL stretch 2 x 30" Seated 3-way lumbar flexion stretch with UE support on green Pball x 30" each  MANUAL THERAPY: To promote normalized muscle tension and reduced pain. STM/DTM and manual TPR to L upper glutes  SELF CARE: Provided instruction in self-STM techniques to L glutes using tennis ball on wall.   08/20/22 THERAPEUTIC EXERCISE: to improve flexibility, strength and mobility.  Verbal and tactile cues throughout for technique. NuStep L4 x 6 min Supine quad stretch R/L 2x30 sec hold- decreased length on L>R  Supine SLR R/L 1x10- cues for slow controlled motion Seated piriformis stretch R/L 2x30 sec hold- cues to lean forward as tolerated for increased stretch Seated hip flexion marches RTB around knees 2x10 Seated single leg hip abduction R/L RTB around knees 2x10 Seated hip add iso ball squeeze 1x10 with 5 second hold- visual and tactile cues to hold for 5 seconds Heel raises/Toe raises 2x10 Standing Lumbar Extension at wall 4x10 sec hold Standing Hip Abduction walks with RTB along wall 4x50ft- cues to keep tension on band during movement    PATIENT EDUCATION:  Education details: HEP review and self-STM techniques to L glutes using tennis ball on wall Person educated: Patient Education method: Explanation, Demonstration, and Verbal cues Education comprehension: verbalized  understanding, verbal cues required, and needs further education   HOME EXERCISE PROGRAM: Access Code: [email or web address] URL: [email or web address] Date: 08/22/2022 Prepared by: Annie Paras  Exercises - Seated Piriformis Stretch  - 2 x daily - 7 x weekly - 3 sets - 15 second hold - Seated Hip Abduction with Resistance  - 1 x daily - 7 x weekly - 3 sets - 10 reps - Seated March with Resistance  - 1 x daily - 7 x weekly - 2 sets - 10 reps - Sit to Stand  - 1 x daily - 7 x weekly - 2 sets - 10 reps - Heel Toe Raises with Counter Support  - 1 x daily - 7 x weekly - 2 sets - 10 reps - Standing Lumbar Extension at [place]  - 1 x daily - 7 x weekly - 2 sets - 10 reps - Side Stepping with Resistance at Thighs  - 1 x daily - 7 x weekly - 2 sets - 10 reps - Seated Quadratus Lumborum Stretch in Chair (Mirrored)  - 2 x daily - 7 x weekly - 3 reps - 30 sec hold - Seated Flexion Stretch with Swiss Ball  - 2 x daily - 7 x weekly - 3 reps - 30 sec hold - Seated Thoracic Flexion and Rotation with Swiss Ball  - 2 x daily - 7 x weekly - 3 reps - 30 sec hold - Glute Max Release With [employer] Against Wall  - 1 x daily - 7 x weekly - 2 sets - 1-2 min hold   ASSESSMENT:  CLINICAL IMPRESSION: Ethelean reports decreased pain and improvement in her overall motion. She states that she really  enjoys the stretches at home. This session focused on TE to improve lumbar mobility and proximal hip muscle flexibility and strengthening for core stabilization. She tolerated therapy well but continues to need tactile cues and demonstration for understanding the task and how to make modifications of HEP. She will continue to benefit form therapy to improve flexibility and strength in proximal muscles for better activity tolerance for everyday tasks.   OBJECTIVE IMPAIRMENTS: Abnormal gait, decreased activity tolerance, decreased balance, decreased endurance, decreased knowledge of condition, decreased mobility,  difficulty walking, decreased strength, increased fascial restrictions, impaired perceived functional ability, increased muscle spasms, impaired flexibility, improper body mechanics, and pain.   ACTIVITY LIMITATIONS: carrying, lifting, sitting, standing, squatting, stairs, transfers, bed mobility, and locomotion level  PARTICIPATION LIMITATIONS: meal prep, cleaning, shopping, community activity, and caring for grandkids  PERSONAL FACTORS: Age, Fitness, Past/current experiences, Time since onset of injury/illness/exacerbation, and 3+ comorbidities: HTN, OA, DDD, overactive bladder  are also affecting patient's functional outcome.   REHAB POTENTIAL: Good  CLINICAL DECISION MAKING: Stable/uncomplicated  EVALUATION COMPLEXITY: Low   GOALS: Goals reviewed with patient? Yes  SHORT TERM GOALS: Target date: 08/27/2022   Patient will be independent with initial HEP to improve outcomes and carryover.  Baseline:  Goal status: MET11/28/23  LONG TERM GOALS: Target date: 09/17/2022    Patient will be independent with ongoing/advanced HEP for self-management at home.  Baseline:  Goal status: IN PROGRESS  2.  Patient will report 75% improvement in low back pain to improve QOL.  Baseline:  Goal status: IN PROGRESS  3.  Patient will report 75% improvement in L knee pain to improve activity tolerance.  Baseline:  Goal status: IN PROGRESS  4.  Patient to demonstrate ability to achieve and maintain good spinal alignment/posturing and body mechanics needed for daily activities. Baseline:  Goal status: IN PROGRESS  5.  Patient will demonstrate full pain free lumbar ROM to perform ADLs.   Baseline:  Goal status: IN PROGRESS  6.  Patient will demonstrate improved B LE strength to >/= 4+/5 for improved stability and ease of mobility . Baseline:  Goal status: IN PROGRESS  7.  Patient will report >/=  40/80 on LEFS to demonstrate improved functional ability.  Baseline: 31 / 80 = 38.8% Goal  status: IN PROGRESS   8. Patient will report </= 18% on Modified Oswestry to demonstrate improved functional ability with decreased pain interference. Baseline: 15 / 50 = 30.0 % Goal status: IN PROGRESS  9.  Patient to report ability to perform ADLs, household, and leisure activities without limitation due to LBP or L knee pain, LOM or weakness. Baseline:  Goal status: IN PROGRESS   PLAN:  PT FREQUENCY: 2x/week  PT DURATION: 6 weeks  PLANNED INTERVENTIONS: Therapeutic exercises, Therapeutic activity, Neuromuscular re-education, Balance training, Gait training, Patient/Family education, Self Care, Joint mobilization, Stair training, DME instructions, Dry Needling, Electrical stimulation, Spinal mobilization, Cryotherapy, Moist heat, Taping, Vasopneumatic device, Traction, Ultrasound, Ionotophoresis 4mg /ml Dexamethasone, Manual therapy, and Re-evaluation.  PLAN FOR NEXT SESSION: hip/quad and glute/piriformis tightness, core and proximal LE strengthening, gait   Zeb Comfort, Student-PT 08/27/2022, 11:04 AM

## 2022-08-29 ENCOUNTER — Ambulatory Visit: Payer: Self-pay

## 2022-08-29 DIAGNOSIS — M5459 Other low back pain: Secondary | ICD-10-CM

## 2022-08-29 DIAGNOSIS — M25562 Pain in left knee: Secondary | ICD-10-CM

## 2022-08-29 DIAGNOSIS — M62838 Other muscle spasm: Secondary | ICD-10-CM

## 2022-08-29 DIAGNOSIS — R262 Difficulty in walking, not elsewhere classified: Secondary | ICD-10-CM

## 2022-08-29 DIAGNOSIS — M6281 Muscle weakness (generalized): Secondary | ICD-10-CM

## 2022-08-29 DIAGNOSIS — R2689 Other abnormalities of gait and mobility: Secondary | ICD-10-CM

## 2022-08-29 NOTE — Therapy (Addendum)
OUTPATIENT PHYSICAL THERAPY TREATMENT / DISCHARGE SUMMARY   Patient Name: Nature Millet MRN: [id number] DOB:[birth year], 81 y.o., female Today's Date: 08/29/2022   PT End of Session - 08/29/22 1103     Visit Number 6    Date for PT Re-Evaluation 09/17/22    Authorization Type [employer] - VL: 35 (PT/OT/ST/Chiro combined)    PT Start Time 1021   pt late   PT Stop Time 1059    PT Time Calculation (min) 38 min    Activity Tolerance Patient tolerated treatment well    Behavior During Therapy WFL for tasks assessed/performed                Past Medical History:  Diagnosis Date   Hypertension    Past Surgical History:  Procedure Laterality Date   VAGINAL PROLAPSE REPAIR     Patient Active Problem List   Diagnosis Date Noted   Hypertension 07/11/2022   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 07/11/2022   Primary osteoarthritis of left knee 07/11/2022   Overactive bladder 07/11/2022    PCP: Charlott Rakes, MD   REFERRING PROVIDER: Charlott Rakes, MD   REFERRING DIAG:  M51.37 (ICD-10-CM) - 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  M17.12 (ICD-10-CM) - Primary osteoarthritis of left knee    THERAPY DIAG:  Acute pain of left knee  Other low back pain  Muscle weakness (generalized)  Other muscle spasm  Difficulty in walking, not elsewhere classified  Other abnormalities of gait and mobility  RATIONALE FOR EVALUATION AND TREATMENT: Rehabilitation  ONSET DATE: Chronic  NEXT MD VISIT: 10/15/2022   SUBJECTIVE:                                                                                                                                                                                           SUBJECTIVE STATEMENT: Pt notes that she has been doing self STM with ball laying down, her pain has improved in her back.  PAIN:  Are you having pain? Yes: NPRS scale: 1-2/10 Pain location: L anterior knee Pain description: aching Aggravating factors: going down  stairs, walking, household chores Relieving factors: nothing  Are you having pain? Yes: NPRS scale: 0/10 Pain location: midline to L low back & buttocks Pain description: sore Aggravating factors: uncertain Relieving factors: Tylenol, hot water bottle, topical analgesic  PERTINENT HISTORY: HTN, OA, DDD, overactive bladder  PRECAUTIONS: Fall  WEIGHT BEARING RESTRICTIONS: No  FALLS:  Has patient fallen in last 6 months? Yes. Number of falls 1 - yesterday (foot slipped on the steps)  LIVING ENVIRONMENT: Lives with: lives with their family Lives in: House/apartment Stairs: Yes: Internal: 14 steps;  on left going up Has following equipment at home: None  OCCUPATION: Retired  PLOF: Independent and Leisure: taking care of grandchildren, cooking, walking in the house/yard  PATIENT GOALS: "For the knee pain to leave me alone."   OBJECTIVE:   DIAGNOSTIC FINDINGS:  10/16/20 - Lumbar x-ray: Impression: 1.  No acute fracture. 2.  Mild dextrocurvature centered at L3. 3.  Trace anterolisthesis of L4 on L5. 4.  Multilevel disc disease, advanced at L4-L5 and L5-S1. 5.  Multilevel facet osteoarthritic disease, advanced at L5-S1 6.  Mild sacroiliac joint degenerative changes. 7.  Osteopenia. 10/16/20 - L knee x-ray (3 views): Impression: 1.  No acute fracture or malalignment. 2.  No joint effusion. 3.  Moderate degenerative changes of the patellofemoral and medial compartment. 4.  Osteopenia.  PATIENT SURVEYS:  Modified Oswestry 15 / 50 = 30.0%  LEFS 31 / 80 = 38.8%  SCREENING FOR RED FLAGS: Bowel or bladder incontinence: No Spinal tumors: No Cauda equina syndrome: No Compression fracture: No Abdominal aneurysm: No  COGNITION:  Overall cognitive status: Within functional limits for tasks assessed    SENSATION: WFL  MUSCLE LENGTH: Hamstrings: WNL ITB: WNL Piriformis: mod tight B Hip flexors: mild tight B Quads: mild tight B Heelcord: NT  POSTURE:  No Significant  postural limitations  PALPATION: Deferred d/t lack of time  LUMBAR ROM:   Active  A/PROM  eval  Flexion WNL  Extension WFL *  Right lateral flexion WNL  Left lateral flexion WNL *  Right rotation WFL  Left rotation WFL  (Blank rows = not tested) * pain in L low back/buttock  LOWER EXTREMITY ROM:     Active  Right eval Left eval  Hip flexion    Hip extension    Hip abduction    Hip adduction    Hip internal rotation    Hip external rotation    Knee flexion 127 125  Knee extension 0 0  Ankle dorsiflexion    Ankle plantarflexion    Ankle inversion    Ankle eversion    (Blank rows = not tested)  LOWER EXTREMITY MMT:  (tested in sitting on eval - pt having difficulty understanding testing instructions)  MMT Right eval Left eval  Hip flexion 4 4-  Hip extension 4 4-  Hip abduction 4 4  Hip adduction 4 4  Hip internal rotation    Hip external rotation    Knee flexion 4 4-  Knee extension 4 4  Ankle dorsiflexion    Ankle plantarflexion    Ankle inversion    Ankle eversion     (Blank rows = not tested)  LUMBAR SPECIAL TESTS:  Straight leg raise test: Negative and FABER test: Positive  FUNCTIONAL TESTS:  TBD as indicated  GAIT: Distance walked: 70 Assistive device utilized: None Level of assistance: Complete Independence Gait pattern: decreased stance time- Left and antalgic Comments: Pt reports more of a limp today after slip and fall on the stairs yesterday   TODAY'S TREATMENT: 08/29/22 THERAPEUTIC EXERCISE: to improve flexibility, strength and mobility.  Verbal and tactile cues throughout for technique. Nustep L4x19mn Seated 3 way lumbar stretch with orange yoga ball x 30 sec each way Seated hip hinge and lumbar extension x 10  Supine KTOS stretch both LE 2x30 sec  Supine ball squeeze with pelvic tilt 10x  Manual Therapy: STM to L lumbar paraspinals and QL  08/27/22 THERAPEUTIC EXERCISE: to improve flexibility, strength and mobility.  Verbal  and tactile cues throughout for technique.  Walking 270 ft around track Standing Hip Flexion leaning on table- forward, R,L -2x30 sec each, cues to use table at home since she doesn't have a ball Seated QL Stretch R/L 2x30 sec Seated lumbar forward flexion repeated motions 2x10 with 3 sec holds Supine figure 4 piriformis stretch with self overpressure pulling knee to chest R/L 2x30 sec  Supine TrA contraction and PPT 1x10 with 5 sec hold- tactile and demonstration needed to perform Glute Bridges 2x20- tactile cues for full hip extension with 5 sec hold Prone Lying 2x30 sec- attempted on elbows but increased LBP  MANUAL THERAPY: To promote normalized muscle tension and reduced pain. STM L piriformis, upper glutes, and lumbar paraspinals   08/22/22 THERAPEUTIC EXERCISE: to improve flexibility, strength and mobility.  Verbal and tactile cues throughout for technique. NuStep L4 x 6 min Hooklying L KTOS piriformis stretch 2 x 30" Attempted l figure-4 heel to hip piriformis stretch but deferred d/t increased L knee pain Seated L QL stretch 2 x 30" Seated 3-way lumbar flexion stretch with UE support on green Pball x 30" each  MANUAL THERAPY: To promote normalized muscle tension and reduced pain. STM/DTM and manual TPR to L upper glutes  SELF CARE: Provided instruction in self-STM techniques to L glutes using tennis ball on wall.   08/20/22 THERAPEUTIC EXERCISE: to improve flexibility, strength and mobility.  Verbal and tactile cues throughout for technique. NuStep L4 x 6 min Supine quad stretch R/L 2x30 sec hold- decreased length on L>R  Supine SLR R/L 1x10- cues for slow controlled motion Seated piriformis stretch R/L 2x30 sec hold- cues to lean forward as tolerated for increased stretch Seated hip flexion marches RTB around knees 2x10 Seated single leg hip abduction R/L RTB around knees 2x10 Seated hip add iso ball squeeze 1x10 with 5 second hold- visual and tactile cues to hold for 5  seconds Heel raises/Toe raises 2x10 Standing Lumbar Extension at wall 4x10 sec hold Standing Hip Abduction walks with RTB along wall 4x109f- cues to keep tension on band during movement    PATIENT EDUCATION:  Education details: HEP review and self-STM techniques to L glutes using tennis ball on wall Person educated: Patient Education method: Explanation, Demonstration, and Verbal cues Education comprehension: verbalized understanding, verbal cues required, and needs further education   HOME EXERCISE PROGRAM: Access Code: [email or web address]: [email or web address] Date: 08/22/2022 Prepared by: JAnnie Paras Exercises - Seated Piriformis Stretch  - 2 x daily - 7 x weekly - 3 sets - 15 second hold - Seated Hip Abduction with Resistance  - 1 x daily - 7 x weekly - 3 sets - 10 reps - Seated March with Resistance  - 1 x daily - 7 x weekly - 2 sets - 10 reps - Sit to Stand  - 1 x daily - 7 x weekly - 2 sets - 10 reps - Heel Toe Raises with Counter Support  - 1 x daily - 7 x weekly - 2 sets - 10 reps - Standing Lumbar Extension at [place] - 1 x daily - 7 x weekly - 2 sets - 10 reps - Side Stepping with Resistance at Thighs  - 1 x daily - 7 x weekly - 2 sets - 10 reps - Seated Quadratus Lumborum Stretch in Chair (Mirrored)  - 2 x daily - 7 x weekly - 3 reps - 30 sec hold - Seated Flexion Stretch with Swiss Ball  - 2 x daily - 7 x weekly - 3  reps - 30 sec hold - Seated Thoracic Flexion and Rotation with Swiss Ball  - 2 x daily - 7 x weekly - 3 reps - 30 sec hold - Glute Max Release With [employer] Against Wall  - 1 x daily - 7 x weekly - 2 sets - 1-2 min hold   ASSESSMENT:  CLINICAL IMPRESSION: Continued with working on lumbar mobility to reduce tension and pain. She required instruction with most exercises for the proper movements and technique. She reports less low back pain, now that she has been using a ball for self STM. Finished session with STM to L low back  muscles to decrease pain. Overall she is progressing well.  OBJECTIVE IMPAIRMENTS: Abnormal gait, decreased activity tolerance, decreased balance, decreased endurance, decreased knowledge of condition, decreased mobility, difficulty walking, decreased strength, increased fascial restrictions, impaired perceived functional ability, increased muscle spasms, impaired flexibility, improper body mechanics, and pain.   ACTIVITY LIMITATIONS: carrying, lifting, sitting, standing, squatting, stairs, transfers, bed mobility, and locomotion level  PARTICIPATION LIMITATIONS: meal prep, cleaning, shopping, community activity, and caring for grandkids  PERSONAL FACTORS: Age, Fitness, Past/current experiences, Time since onset of injury/illness/exacerbation, and 3+ comorbidities: HTN, OA, DDD, overactive bladder  are also affecting patient's functional outcome.   REHAB POTENTIAL: Good  CLINICAL DECISION MAKING: Stable/uncomplicated  EVALUATION COMPLEXITY: Low   GOALS: Goals reviewed with patient? Yes  SHORT TERM GOALS: Target date: 08/27/2022   Patient will be independent with initial HEP to improve outcomes and carryover.  Baseline:  Goal status: MET11/28/23  LONG TERM GOALS: Target date: 09/17/2022    Patient will be independent with ongoing/advanced HEP for self-management at home.  Baseline:  Goal status: IN PROGRESS  2.  Patient will report 75% improvement in low back pain to improve QOL.  Baseline:  Goal status: IN PROGRESS  3.  Patient will report 75% improvement in L knee pain to improve activity tolerance.  Baseline:  Goal status: IN PROGRESS  4.  Patient to demonstrate ability to achieve and maintain good spinal alignment/posturing and body mechanics needed for daily activities. Baseline:  Goal status: IN PROGRESS  5.  Patient will demonstrate full pain free lumbar ROM to perform ADLs.   Baseline:  Goal status: IN PROGRESS  6.  Patient will demonstrate improved B LE strength  to >/= 4+/5 for improved stability and ease of mobility . Baseline:  Goal status: IN PROGRESS  7.  Patient will report >/=  40/80 on LEFS to demonstrate improved functional ability.  Baseline: 31 / 80 = 38.8% Goal status: IN PROGRESS   8. Patient will report </= 18% on Modified Oswestry to demonstrate improved functional ability with decreased pain interference. Baseline: 15 / 50 = 30.0 % Goal status: IN PROGRESS  9.  Patient to report ability to perform ADLs, household, and leisure activities without limitation due to LBP or L knee pain, LOM or weakness. Baseline:  Goal status: IN PROGRESS   PLAN:  PT FREQUENCY: 2x/week  PT DURATION: 6 weeks  PLANNED INTERVENTIONS: Therapeutic exercises, Therapeutic activity, Neuromuscular re-education, Balance training, Gait training, Patient/Family education, Self Care, Joint mobilization, Stair training, DME instructions, Dry Needling, Electrical stimulation, Spinal mobilization, Cryotherapy, Moist heat, Taping, Vasopneumatic device, Traction, Ultrasound, Ionotophoresis '4mg'$ /ml Dexamethasone, Manual therapy, and Re-evaluation.  PLAN FOR NEXT SESSION: hip/quad and glute/piriformis tightness, core and proximal LE strengthening, gait   Jaiyah Beining L Carloyn Lahue, PTA 08/29/2022, 11:35 AM   PHYSICAL THERAPY DISCHARGE SUMMARY  Visits from Start of Care: 6  Current functional level  related to goals / functional outcomes:   Refer to above clinical impression and goal assessment for status as of last visit on 08/29/22. Patient's daughter cancelled her next scheduled appointment which was intended to be a recert visit and did not schedule any further visits in >30 days, therefore will proceed with discharge from PT for this episode.     Remaining deficits:   As above. Unable to formally assess status at discharge due to failure to return to PT.   Education / Equipment:   HEP   Patient agrees to discharge. Patient goals were partially met. Patient is being  discharged due to not returning since the last visit.  Percival Spanish, PT, MPT 11/25/22, 2:16 PM  [facility] [address]  [place], [place], [address] Phone: [phone]   Fax:  [phone]

## 2022-09-03 ENCOUNTER — Encounter: Payer: 59 | Admitting: Physical Therapy

## 2022-09-05 ENCOUNTER — Other Ambulatory Visit: Payer: 59

## 2022-10-11 ENCOUNTER — Other Ambulatory Visit: Payer: 59

## 2022-10-15 ENCOUNTER — Ambulatory Visit: Payer: Self-pay | Admitting: Family Medicine

## 2022-11-07 ENCOUNTER — Ambulatory Visit: Payer: Self-pay | Admitting: Physician Assistant

## 2022-11-08 ENCOUNTER — Ambulatory Visit: Payer: Self-pay | Attending: Internal Medicine | Admitting: Internal Medicine

## 2022-11-08 ENCOUNTER — Encounter: Payer: Self-pay | Admitting: Internal Medicine

## 2022-11-08 VITALS — BP 122/70 | HR 79 | Temp 98.4°F | Ht 61.0 in | Wt 141.0 lb

## 2022-11-08 DIAGNOSIS — K649 Unspecified hemorrhoids: Secondary | ICD-10-CM

## 2022-11-08 DIAGNOSIS — N950 Postmenopausal bleeding: Secondary | ICD-10-CM

## 2022-11-08 DIAGNOSIS — K625 Hemorrhage of anus and rectum: Secondary | ICD-10-CM

## 2022-11-08 DIAGNOSIS — D649 Anemia, unspecified: Secondary | ICD-10-CM

## 2022-11-08 DIAGNOSIS — K5909 Other constipation: Secondary | ICD-10-CM

## 2022-11-08 DIAGNOSIS — N3281 Overactive bladder: Secondary | ICD-10-CM

## 2022-11-08 DIAGNOSIS — I10 Essential (primary) hypertension: Secondary | ICD-10-CM

## 2022-11-08 MED ORDER — AMLODIPINE BESYLATE 5 MG PO TABS: 7.5000 mg | ORAL_TABLET | Freq: Every day | ORAL | 1 refills | Status: AC

## 2022-11-08 MED ORDER — HYDROCORTISONE (PERIANAL) 2.5 % EX CREA: 1.0000 | TOPICAL_CREAM | Freq: Two times a day (BID) | CUTANEOUS | 0 refills | Status: AC

## 2022-11-08 NOTE — Progress Notes (Signed)
Patient ID: Christina Hooper, female    DOB: 19-Nov-1940  MRN: [id number]  CC: Concerns (Excessive urination - 8-10X a day, weakness, findings of blood in underwear similar to menstruation. Will have it consistently for 3 days & then stop for 1 day & then come back X2weeks/Boil on anus, bleeding, /Already received flu vax. )   Subjective: Christina Hooper is a 82 y.o. female who presents for UC visit.  PCP is Dr. Margarita Hooper.  Daughter, Christina Hooper, is with her and provides most of the hx Her concerns today include:  Patient with history of lumbar DDD/OA, OA of the left knee, OAB, vitamin D deficiency, subclinical hyperthyroidism, hyperCa  C/o excessive urination for months.  Tested for DM was negative.  Given Vesicare which has not help.  Urinates about 5 times at nights.  Daughter is wondering if the HCTZ can be discontinued as she feels this contributes to the frequent urination.  Patient has no incontinence of urine. -c/o vaginal bleeding for the past 1 to 2 weeks.  Having to wear a pad and change 3 times a day.  Light to moderate bleeding.  Happens for several days at a time.   No cramps or abdominal pain.  No blood in urine.    Noticed 3 boils on anus x 1 wk.  2  ruptured 1-2 days ago.  Bleeding occurs when they rupture.  Daughter states that she has been constipated.  She has been eating some prunes.    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 20 capsule 0   meloxicam (MOBIC) 7.5 MG tablet Take 1 tablet (7.5 mg total) by mouth daily. 30 tablet 1   methimazole (TAPAZOLE) 5 MG tablet Take 1 tablet (5 mg total) by mouth 2 (two) times daily. 60  tablet 2   Omega-3 1000 MG CAPS Take by mouth.     omeprazole (PRILOSEC) 20 MG capsule Take 20 mg by mouth daily.     polyethylene glycol (MIRALAX / GLYCOLAX) 17 g packet Take 1 packet (17 g) by mouth daily as needed for constipation.     senna-docusate (SENOKOT-S) 8.6-50 MG per tablet Take 1 tablet by mouth daily. 20 tablet 0   solifenacin (VESICARE) 5 MG tablet Take 1 tablet (5 mg total) by mouth daily. 30 tablet 3   No current facility-administered medications on file prior to visit.    No Known Allergies  Social History   Socioeconomic History   Marital status: Single    Spouse name: Not on file   Number of children: Not on file   Years of education: Not on file   Highest education level: Not on file  Occupational History   Not on file  Tobacco Use   Smoking status: Never   Smokeless tobacco: Not on file  Substance and Sexual Activity   Alcohol use: No   Drug use: No   Sexual activity: Not on file  Other Topics Concern   Not on file  Social History Narrative   Not on file   Social Determinants of Health   Financial Resource Strain: Not on file  Food Insecurity: Not on  file  Transportation Needs: Not on file  Physical Activity: Not on file  Stress: Not on file  Social Connections: Not on file  Intimate Partner Violence: Not on file    No family history on file.  Past Surgical History:  Procedure Laterality Date   VAGINAL PROLAPSE REPAIR      ROS: Review of Systems Negative except as stated above  PHYSICAL EXAM: BP 122/70 (BP Location: Left Arm, Patient Position: Sitting, Cuff Size: Normal)   Pulse 79   Temp 98.4 F (36.9 C) (Oral)   Ht 5' 1"$  (1.549 m)   Wt 141 lb (64 kg)   SpO2 99%   BMI 26.64 kg/m   Physical Exam  General appearance - alert, well appearing, and in no distress Mental status - normal mood, behavior, speech, dress, motor activity, and thought processes Pelvic -she seems to have a small amount of blood noted at the opening to the  vaginal canal. Rectal -she has skin tags and small firm l mass at the 3 o'clock position that looks like it is a hemorrhoid.  It is not tender to touch.      Latest Ref Rng & Units 07/30/2022    8:49 AM 07/16/2022    9:15 AM 01/05/2021   12:00 AM  CMP  Glucose 70 - 99 mg/dL  87  67   BUN 8 - 27 mg/dL  23  24   Creatinine 0.57 - 1.00 mg/dL  0.97  0.96   Sodium 134 - 144 mmol/L  143  143   Potassium 3.5 - 5.2 mmol/L  4.7  4.1   Chloride 96 - 106 mmol/L  102  104   CO2 20 - 29 mmol/L  26  32   Calcium 8.7 - 10.3 mg/dL 10.6  11.0  10.2   Total Protein 6.0 - 8.5 g/dL  7.4  6.8   Total Bilirubin 0.0 - 1.2 mg/dL  1.1  1.1   Alkaline Phos 44 - 121 IU/L  97    AST 0 - 40 IU/L  18  19   ALT 0 - 32 IU/L  13  19    Lipid Panel     Component Value Date/Time   CHOL 163 07/16/2022 0915   TRIG 51 07/16/2022 0915   HDL 79 07/16/2022 0915   CHOLHDL 2.0 01/05/2021 0000   LDLCALC 73 07/16/2022 0915   LDLCALC 63 01/05/2021 0000    CBC    Component Value Date/Time   WBC 7.0 07/16/2022 0915   WBC 6.5 01/05/2021 0000   RBC 4.46 07/16/2022 0915   RBC 4.40 01/05/2021 0000   HGB 12.3 07/16/2022 0915   HCT 38.2 07/16/2022 0915   PLT 267 07/16/2022 0915   MCV 86 07/16/2022 0915   MCH 27.6 07/16/2022 0915   MCH 29.1 01/05/2021 0000   MCHC 32.2 07/16/2022 0915   MCHC 31.6 (L) 01/05/2021 0000   RDW 11.1 (L) 07/16/2022 0915   LYMPHSABS 1.8 07/16/2022 0915   EOSABS 0.1 07/16/2022 0915   BASOSABS 0.0 07/16/2022 0915    ASSESSMENT AND PLAN: 1. Postmenopausal bleeding Patient will need to see a gynecologist as soon as possible.  Daughter tells me she has insurance but when the front desk ran [employer], it was inactive.  Advised daughter to call the insurance to see what is going on there.  If it is an active, they should apply for the cone discount as soon as possible. - Ambulatory referral to Gynecology - US Pelvis Complete;  Future - CBC  2. Hemorrhoids, unspecified hemorrhoid  type Discussed diagnosis. Recommend keeping the bowel movements soft and regular by increasing fiber in the diet which can be in the form of green leafy vegetables and fruits and drinking several glasses of water daily. Purchase MiraLAX over-the-counter and use as needed. Sitz bath's recommended. Prescription given for Anusol cream.  3. Rectal bleeding - Ambulatory referral to Gastroenterology - CBC - hydrocortisone (ANUSOL-HC) 2.5 % rectal cream; Place 1 Application rectally 2 (two) times daily.  Dispense: 30 g; Refill: 0  4. Other constipation See #2 above.  5. Overactive bladder HCTZ may be contributing to the frequent urination.  We will have her stop the HCTZ and increase amlodipine instead.  6. Primary hypertension See #5 above. - amLODipine (NORVASC) 5 MG tablet; Take 1.5 tablets (7.5 mg total) by mouth daily.  Dispense: 90 tablet; Refill: 1   Addendum 11/09/2022: Patient with anemia on blood test.  Will see if lab can add iron studies.  Prescription sent to the pharmacy for iron supplement in the meantime. Patient was given the opportunity to ask questions.  Patient verbalized understanding of the plan and was able to repeat key elements of the plan.   This documentation was completed using Radio producer.  Any transcriptional errors are unintentional.  Orders Placed This Encounter  Procedures   US Pelvis Complete   CBC   Ambulatory referral to Gynecology   Ambulatory referral to Gastroenterology     Requested Prescriptions   Signed Prescriptions Disp Refills   amLODipine (NORVASC) 5 MG tablet 90 tablet 1    Sig: Take 1.5 tablets (7.5 mg total) by mouth daily.   hydrocortisone (ANUSOL-HC) 2.5 % rectal cream 30 g 0    Sig: Place 1 Application rectally 2 (two) times daily.    Return in about 6 weeks (around 12/20/2022) for Give 6 wks f/u with her PCP Dr. Margarita Hooper.  Karle Plumber, MD, FACP

## 2022-11-08 NOTE — Patient Instructions (Signed)
Stop hydrochlorothiazide. Increase amlodipine to 1-1/2 tablets daily. Use MiraLAX daily as needed to help decrease constipation.  This can be purchased over-the-counter.  I have prescribed some rectal cream for you to use twice a day to help soothe the hemorrhoids.  We have referred you to a gastroenterologist.  In regard to the vaginal bleeding, we have referred you to see a gynecologist.

## 2022-11-09 ENCOUNTER — Encounter: Payer: Self-pay | Admitting: Internal Medicine

## 2022-11-09 LAB — CBC
Hematocrit: 32.9 % — ABNORMAL LOW (ref 34.0–46.6)
Hemoglobin: 10.6 g/dL — ABNORMAL LOW (ref 11.1–15.9)
MCH: 27.2 pg (ref 26.6–33.0)
MCHC: 32.2 g/dL (ref 31.5–35.7)
MCV: 85 fL (ref 79–97)
Platelets: 337 10*3/uL (ref 150–450)
RBC: 3.89 x10E6/uL (ref 3.77–5.28)
RDW: 11.8 % (ref 11.7–15.4)
WBC: 10.5 10*3/uL (ref 3.4–10.8)

## 2022-11-09 MED ORDER — FERROUS SULFATE 325 (65 FE) MG PO TABS: 325.0000 mg | ORAL_TABLET | Freq: Every day | ORAL | 1 refills | Status: AC

## 2022-11-09 NOTE — Progress Notes (Signed)
Let patient and daughter know that she has developed anemia meaning a drop in her blood count likely due to to the vaginal and rectal bleeding.  We have referred her to a gynecologist and gastroenterologist as discussed on her recent visit.  I will ask the lab to add iron level to the blood that was already drawn.  In the meantime, she can start taking iron supplement 1 tablet daily.  Prescription sent to the pharmacy.

## 2022-11-09 NOTE — Addendum Note (Signed)
Addended by: Karle Plumber B on: 11/09/2022 06:02 PM   Modules accepted: Orders

## 2022-11-13 ENCOUNTER — Telehealth: Payer: Self-pay | Admitting: Emergency Medicine

## 2022-11-13 NOTE — Telephone Encounter (Signed)
Copied from [place] [phone]. Topic: Appointment Scheduling - Scheduling Inquiry for Clinic >> Nov 13, 2022  8:55 AM Erskine Squibb wrote: Reason for CRM: Elberta Fortis with the radiology scheduling dept called in inquiring if the provider wants to add w/ transvaginal to the complete pelvic ultrasound. She is scheduled for Friday and they were wondering that with the transvaginal in usually yields better results. Please assist further if they need to add this to the order for the [facility]

## 2022-11-14 ENCOUNTER — Other Ambulatory Visit: Payer: Self-pay

## 2022-11-15 ENCOUNTER — Ambulatory Visit (HOSPITAL_COMMUNITY): Payer: Self-pay

## 2022-11-20 ENCOUNTER — Telehealth: Payer: Self-pay | Admitting: Internal Medicine

## 2022-11-20 NOTE — Telephone Encounter (Signed)
-----   Message from Ena Dawley sent at 11/13/2022  1:27 PM EST ----- Regarding: RE: GYN referral Un able to schedule an appointment  sooner  than  March  8   [facility] clinic  Called patient to schedule and spoke with her daughter.  He daughter stated that she takes care of her mother and handles all of her appts.  Offered appt on March 8th but [place] will be out of the [place] and will return some time in September.   I sent a new referral to  [facility]  and they don't have nothing  until  April  .  ----- Message ----- From: Ladell Pier, MD Sent: 11/08/2022   1:20 PM EST To: Ena Dawley Subject: GYN referral                                   Please get her in ASAP.

## 2022-11-21 ENCOUNTER — Ambulatory Visit (HOSPITAL_COMMUNITY): Payer: Self-pay

## 2022-11-22 ENCOUNTER — Telehealth: Payer: Self-pay

## 2022-11-22 ENCOUNTER — Ambulatory Visit (HOSPITAL_COMMUNITY)
Admission: RE | Admit: 2022-11-22 | Discharge: 2022-11-22 | Disposition: A | Discharge status: Home / Self Care | Payer: Self-pay | Source: Ambulatory Visit | Attending: Internal Medicine | Admitting: Internal Medicine

## 2022-11-22 DIAGNOSIS — N950 Postmenopausal bleeding: Secondary | ICD-10-CM | POA: Insufficient documentation

## 2022-11-22 NOTE — Telephone Encounter (Signed)
FYI

## 2022-11-22 NOTE — Telephone Encounter (Signed)
Call from [facility] at [facility] radiology regarding report US pelvis complete.   Results are available for review.

## 2022-11-23 ENCOUNTER — Telehealth: Payer: Self-pay | Admitting: Internal Medicine

## 2022-11-23 NOTE — Telephone Encounter (Signed)
Phone call placed to patient's daughter this morning Roslyn.  She was able to confirm the patient's date of birth.  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  In any event she will need to see the gynecologist to have uterine biopsy done and further evaluation.  Daughter reports pt will be going to [place] this Thursday and has not received appt with GYN as yet.  I advised that if she does not get in with GYN before she leaves 11/28/2022, then she should see a GYN in her country.  Advised to stop by the office next week to get a copy of the pelvic US report to take with her.  Daughter expressed understanding and all questions were answered.

## 2022-12-19 ENCOUNTER — Ambulatory Visit: Payer: Self-pay | Attending: Family Medicine | Admitting: Family Medicine
